# Patient Record
Sex: Male | Born: 1950 | Race: White | Hispanic: No | Marital: Married
Health system: Midwestern US, Academic
[De-identification: ages and names within clinical notes are randomized; demographics above are authoritative.]

---

## 2016-10-25 MED ORDER — RIMABOTULINUMTOXINB 2,500 UNIT/0.5 ML IM SOLN
2500 [IU] | Freq: Once | INTRAMUSCULAR | 0 refills | Status: CP
Start: 2016-10-25 — End: ?

## 2016-10-25 MED ORDER — ONABOTULINUMTOXINA 100 UNIT IJ SOLR
120 [IU] | Freq: Once | INTRAMUSCULAR | 0 refills | Status: CP
Start: 2016-10-25 — End: ?

## 2016-11-11 MED ORDER — CLONAZEPAM 0.5 MG PO TAB
.5 mg | ORAL_TABLET | Freq: Every evening | ORAL | 5 refills | Status: DC
Start: 2016-11-11 — End: 2017-04-07

## 2016-11-18 ENCOUNTER — Encounter: Admit: 2016-11-18 | Discharge: 2016-11-18 | Payer: MEDICARE

## 2016-11-18 MED FILL — PIMAVANSERIN 17 MG PO TAB: 17 mg | ORAL | 30 days supply | Qty: 60 | Fill #4 | Status: CP

## 2016-11-21 ENCOUNTER — Encounter: Admit: 2016-11-21 | Discharge: 2016-11-21 | Payer: MEDICARE

## 2016-12-01 ENCOUNTER — Encounter: Admit: 2016-12-01 | Discharge: 2016-12-01 | Payer: MEDICARE

## 2016-12-09 ENCOUNTER — Encounter: Admit: 2016-12-09 | Discharge: 2016-12-09 | Payer: MEDICARE

## 2016-12-19 ENCOUNTER — Encounter: Admit: 2016-12-19 | Discharge: 2016-12-19 | Payer: MEDICARE

## 2016-12-19 MED FILL — PIMAVANSERIN 17 MG PO TAB: 17 mg | ORAL | 30 days supply | Qty: 60 | Fill #5 | Status: CP

## 2016-12-20 ENCOUNTER — Encounter: Admit: 2016-12-20 | Discharge: 2016-12-20 | Payer: MEDICARE

## 2016-12-27 ENCOUNTER — Encounter: Admit: 2016-12-27 | Discharge: 2016-12-27 | Payer: MEDICARE

## 2016-12-27 DIAGNOSIS — G20 Parkinson's disease: Principal | ICD-10-CM

## 2016-12-27 MED ORDER — CARBIDOPA-LEVODOPA 25-100 MG PO TAB
.5 | ORAL_TABLET | Freq: Every day | ORAL | 11 refills | Status: AC
Start: 2016-12-27 — End: 2017-02-15
  Filled 2016-12-27: qty 420, 90d supply

## 2016-12-27 NOTE — Telephone Encounter
Reduce Sinemet (carbidopa/levodopa) 25/100 to 0.5 tab five times a day

## 2016-12-27 NOTE — Telephone Encounter
This nurse called and left a message for patient/patient's wife to call back for further medication dosage changes.

## 2016-12-27 NOTE — Telephone Encounter
Patient's wife called back and this nurse was able to provide her with verbal orders for reduced sinemet dosage. Patient's wife provided verbal understanding by teaching back new Sinemet dosage.

## 2016-12-27 NOTE — Telephone Encounter
Patient's wife called stating the patient is having increasing difficulty eating d/t his tongue movements and states they are at a loss of what to do. Please advise.

## 2016-12-29 ENCOUNTER — Encounter: Admit: 2016-12-29 | Discharge: 2016-12-29 | Payer: MEDICARE

## 2017-01-06 ENCOUNTER — Encounter: Admit: 2017-01-06 | Discharge: 2017-01-06 | Payer: MEDICARE

## 2017-01-10 ENCOUNTER — Encounter: Admit: 2017-01-10 | Discharge: 2017-01-10 | Payer: MEDICARE

## 2017-01-12 ENCOUNTER — Encounter: Admit: 2017-01-12 | Discharge: 2017-01-12 | Payer: MEDICARE

## 2017-01-13 MED FILL — PIMAVANSERIN 17 MG PO TAB: 17 mg | ORAL | 30 days supply | Qty: 60 | Status: CN

## 2017-01-16 ENCOUNTER — Encounter: Admit: 2017-01-16 | Discharge: 2017-01-16 | Payer: MEDICARE

## 2017-01-16 MED FILL — PIMAVANSERIN 17 MG PO TAB: 17 mg | ORAL | 30 days supply | Qty: 60 | Fill #6 | Status: CP

## 2017-01-17 ENCOUNTER — Encounter: Admit: 2017-01-17 | Discharge: 2017-01-17 | Payer: MEDICARE

## 2017-01-19 ENCOUNTER — Encounter: Admit: 2017-01-19 | Discharge: 2017-01-19 | Payer: MEDICARE

## 2017-01-25 DIAGNOSIS — K117 Disturbances of salivary secretion: Secondary | ICD-10-CM

## 2017-01-25 MED ORDER — RIMABOTULINUMTOXINB 5,000 UNIT/ML IM SOLN
5000 [IU] | Freq: Once | 0 refills | Status: CP
Start: 2017-01-25 — End: ?
  Administered 2017-01-25: 19:00:00 5000 [IU]

## 2017-01-25 MED ORDER — ONABOTULINUMTOXINA 100 UNIT IJ SOLR
100 [IU] | Freq: Once | INTRAMUSCULAR | 0 refills | Status: CP
Start: 2017-01-25 — End: ?
  Administered 2017-01-25: 22:00:00 100 [IU] via INTRAMUSCULAR

## 2017-01-25 NOTE — Procedures
Botulinum toxin injection for treatment of Silorrhea    Biological: Myobloc (botulinum toxin strain B)  Dose in Units: 2500  Units drawn up 2500  Units discarded 0  Last Round: 10/25/16  Benefit: Good  Adverse effects None  Wear off date one week ago  Additional notes NA  Procedure:  Patient was injected with Botulinum toxin B 1000 units in each parotid gland and 250 units in each submandibular gland.     Adverse effects at time of injection: None  Follow up: Telephone  2 weeks  Target time for next injection: 12 weeks  Lot Number: [id number]  Expiration Date: 07/20/19

## 2017-01-25 NOTE — Procedures
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  Muscle Left Right   Flexor digitorum longus 30 30   Flexor digitorum brevis 20 20       Was EMG used? Yes   Adverse effects at time of injection: none  Target time for next injection: 13 weeks  Was patient given the biological specific REMS sheet? No

## 2017-01-26 ENCOUNTER — Ambulatory Visit: Admit: 2017-01-25 | Discharge: 2017-01-26 | Payer: MEDICARE

## 2017-01-26 DIAGNOSIS — G249 Dystonia, unspecified: Secondary | ICD-10-CM

## 2017-01-26 DIAGNOSIS — G248 Other dystonia: Principal | ICD-10-CM

## 2017-02-09 ENCOUNTER — Encounter: Admit: 2017-02-09 | Discharge: 2017-02-09 | Payer: MEDICARE

## 2017-02-13 ENCOUNTER — Encounter: Admit: 2017-02-13 | Discharge: 2017-02-13 | Payer: MEDICARE

## 2017-02-13 MED FILL — PIMAVANSERIN 17 MG PO TAB: 17 mg | ORAL | 30 days supply | Qty: 60 | Fill #7 | Status: CP

## 2017-02-14 ENCOUNTER — Encounter: Admit: 2017-02-14 | Discharge: 2017-02-14 | Payer: MEDICARE

## 2017-02-15 ENCOUNTER — Encounter: Admit: 2017-02-15 | Discharge: 2017-02-15 | Payer: MEDICARE

## 2017-02-15 DIAGNOSIS — H547 Unspecified visual loss: ICD-10-CM

## 2017-02-15 DIAGNOSIS — G20 Parkinson's disease: Principal | ICD-10-CM

## 2017-02-15 DIAGNOSIS — R4189 Other symptoms and signs involving cognitive functions and awareness: ICD-10-CM

## 2017-02-15 MED ORDER — CARBIDOPA-LEVODOPA 25-100 MG PO TAB
1 | Freq: Every day | ORAL | 0 refills | Status: AC
Start: 2017-02-15 — End: 2017-07-14

## 2017-02-15 NOTE — Assessment & Plan Note
His symptoms are stable. No medication changes were recommended during the current  visit.

## 2017-02-15 NOTE — Assessment & Plan Note
His drooling is better with MyoBloc (rimabotulinumtoxin B).

## 2017-02-15 NOTE — Progress Notes
Date of Service: 02/15/2017     Subjective:             Damon Macdonald is a 66 y.o. male.    History of Present Illness     Follow-Up Visit    Since my last visit I am: Slighly worse.    Symptoms Scale:    Memory problems: Moderate, affects some things  Hallucinations/delusions: Mild, illusions  Depression: Moderate, bothersome  Anxiety: Mild, can be bothersome  Apathy: Mild, elective activities affected  Impulsive behavior: Mild, affects family  Nighttime sleep: Moderate, wake up multiple times  Daytime sleepiness: Moderate, difficulty staying awake  Vivid dreams: None  REM sleep behavior disorder: None  Restless leg syndrome: Mild, occasionally, mainly on prolonged seating  Pain or muscle cramps: Marked, limits my activities  Urination: Moderate, occasional accidents  Constipation: Moderate, need OTC medication  Dizziness or lightheadedness: Mild, often on standing  Tiredness/Fatigue: Moderate, often tired  Falling: Marked, more than once a week  Personal care assistance: Mild, I have some difficulty and occasionally need help, but don't need help daily or regularly    Total Score:  Total Score: 43    Assistive Devices:  Assistive devices for getting around: Cane    OFF Time:    OFF time: Yes  Hours/day of OFF time: 5  Number of episodes/day: 5  OFF time is bothersome: All of the time  Muscles spasms or strange posturing: Yes  OFF disability: They are disabling to me    Dyskinesia:    Dyskinesia while awake: Yes  Hours/day of dyskinesia: 4  Number of episodes/day: 5  Dyskinesia disability: They are painful to me    Employment Status:    Employment: Retired - due to PD       Review of Systems   HENT: Positive for trouble swallowing.    Gastrointestinal: Negative.    Genitourinary: Positive for urgency. Negative for decreased urine volume, difficulty urinating, discharge, dysuria, enuresis, flank pain, frequency, genital sores, hematuria, penile pain, penile swelling, scrotal swelling and testicular pain. Neurological: Positive for speech difficulty. Negative for tremors.         Objective:         ??? buPROPion XL (WELLBUTRIN XL) 300 mg tablet Take 1 tablet by mouth every morning. Do not crush or chew.   ??? carbidopa/levodopa (SINEMET) 25/100 mg tablet Take one tablet by mouth five times daily.   ??? carbidopa/levodopa CR (SINEMET CR) 50/200 mg tablet Take 1 tablet by mouth six times daily.   ??? clonazePAM (KLONOPIN) 0.5 mg tablet Take 1 tablet by mouth at bedtime daily.   ??? escitalopram oxalate (LEXAPRO) 20 mg tablet Take 1 tablet by mouth daily.   ??? fludrocortisone (FLORINEF) 0.1 mg tablet Take 1 tablet by mouth twice daily.   ??? gabapentin (NEURONTIN) 300 mg capsule Take 1 capsule by mouth at bedtime daily.   ??? melatonin 5 mg tab Take 5 mg by mouth at bedtime daily.   ??? meloxicam (MOBIC) 15 mg tablet Take  by mouth daily.   ??? pimavanserin 17 mg tab Take 2 tablets by mouth daily.     Vitals:    02/15/17 0959 02/15/17 1003   BP: 156/89 142/86   Pulse: 55 57   Weight: 64 kg (141 lb 3.2 oz)    Height: 168.9 cm (66.5)      Body mass index is 22.45 kg/m???.     Physical Exam    EXAMINATION:     ORIENTATION: Alert and Oriented.  Cranial Nerves: II-XII Unremarkable    Right Left   Bradkinesia  Moderate Mild   Tremor Hands Resting None None    Postural None None    Kinetic None None           Tremor Other: None  Dyskinesia: Mild  Muscle Strength: Unremarkable  Gait: Independent but with substantial impairment    PDQ8 - Quality of Life  Had difficulty getting around in public places?: Often   Had difficulty dressing?: Often   Felt depressed?: Often   Had problems with your close personal relationships?: Occasionally     Had problems with your concentration, for example, when reading or watching TV?: Often   Felt unable to communicate effectively?: Sometimes   Had painful muscle cramps or spasms?: Often   Felt embarrassed in public due to having Parkinson's disease?: Ocassionally              PDQ8 Total Score (32 Possible): 19 PDQ8 Total %: 59                               Assessment and Plan:    Problem   Parkinson Disease (Hcc)    Symptoms began in 2010, initial symptoms was change in handwriting. Diagnosed with Parkinson's disease in 2013. Atypical PD Features: Rapid Progression, Tremor Absent and Severe Dysautonomia  10/26/2012 Mentation Score: 8 Activities of Daily Living Score: 28 Motor Exam: 25 Total UPDRS Score: 61   PDQ 39 IMPACT PDQ Total Percent: 58.33 %  01/22/2013 Symptoms got worse when Mirapex (pramipexole) was discontinued.   04/03/2014 PDQ8 Total %: 38   02/09/2015 10:55 PM   11/18/2014 PDQ8 Total %: 62   04/22/2015 PDQ8 Total %: 34   06/03/2015 PDQ8 Total %: 53   PKG: Bradykinesia 26.6, Dyskinesia 1.7, FDS 6.4  09/13/2015 PDQ8 Total %: 41   05/17/2016 PDQ8 Total %: 56   08/15/2016 PDQ8 Total %: 66   Hand Grip Strength:  RIGHT: 25.2 KG  LEFT: 27.5 KG    02/15/2017 PDQ8 Total %: 59   Hand Grip Strength:  RIGHT: 31.8 KG  LEFT: 30.6 KG    Got worse when Sinemet (carbidopa/levodopa) 25/100 was reduced  Patient was evaluated by Speech therapist, Mardella Layman Heidrick.       Drooling    01/25/17 Received MyoBloc (rimabotulinumtoxin B)      Hallucination    06/03/2015 Improved with Nuplazid (pimavanserin)      Insomnia    On Klonopin (clonazepam)      Memory Change    10/26/2012 MOCA Score (out of 30): 20    08/13/12 MRI brain: Unremarkable  02/13/13 Neuropsychology testing: No evidence of dementia     Orthostatic Hypotension    10/2014: florinef started. Patient reports significant improvement since starting.          Parkinson disease (HCC)  Patient is levodopa responsive with on periods and persistent motor complications with disabling off periods for 5 hours while on medical therapy with Sinemet (carbidopa/levodopa) and he has tried at least one other class of anti-PD therapy namely Mirapex (pramipexole). He is a candidate for Duopa (carbidopa/levodopa enteral suspension). Patient was evaluated by Speech therapist, Mardella Layman Heidrick. Insomnia  His symptoms are stable. No medication changes were recommended during the current  visit.      Orthostatic hypotension  His symptoms are stable. No medication changes were recommended during the current  visit.      Memory change  His symptoms are stable. No medication changes were recommended during the current  visit.      Hallucination  His symptoms are stable. No medication changes were recommended during the current  visit.      Drooling  His drooling is better with MyoBloc (rimabotulinumtoxin B).    Patient will follow up in approximately 6 months.

## 2017-02-15 NOTE — Progress Notes
SPEECH PATHOLOGY ENCOUNTER  DEPARTMENT OF NEUROLOGY    NAME: Damon Macdonald    DOB: December 12, 1950   MRN: [id number]  DATE: 02/15/2017   [facility]IC[facility]    CHIEF COMPLAINT/RELEVANT HISTORY  Time-in: 11:14 AM  Time-out: 11:42 AM    History provided by: patient and wife    Speech: Pt reports his speech is worse. He was always a quiet talker, but it is now quieter. He was a Education officer, museum, and had to use a voice amplifier to help with projecting his voice. He did [employer], which seemed to help. However, due to transportation issues he is no longer able to participate.    Swallow: Pt denies changes to swallow function. He maintains a regular diet (which is difficult due to excess saliva or dry mouth. Mealtimes take up to an hour.), drinks thin liquids and takes pills easily. Pt had botox into the vocal cords according to pt's wife. He feels it helped with volume and swallowing. He reports excess saliva and dry mouth symptoms. Pt feels both affect his swallow function. His weight is stable.       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     PLAN OF CARE    Speech  Group speech therapy [facility]SV[facility] LOUD DVD order information provided. Sound Meter Level application (Decibel 10th) can be download to track your loudness. Complete LOUDness exercises every day!    Hearing  No recommendations    Swallowing  Diet regular diet with thin liquids, think swallow, alternate solids with liquids, small bites/sips, good oral hygiene and stronger flavors may help with providing more sensory input related to swallowing. Follow-up through PD clinic.    Therapist: Mardella Layman Dondi Aime  Date: 02/15/2017

## 2017-02-16 ENCOUNTER — Ambulatory Visit: Admit: 2017-02-15 | Discharge: 2017-02-16 | Payer: MEDICARE

## 2017-02-16 DIAGNOSIS — K117 Disturbances of salivary secretion: ICD-10-CM

## 2017-02-16 DIAGNOSIS — R443 Hallucinations, unspecified: ICD-10-CM

## 2017-02-16 DIAGNOSIS — F3289 Other specified depressive episodes: ICD-10-CM

## 2017-02-16 DIAGNOSIS — G4709 Other insomnia: ICD-10-CM

## 2017-02-16 DIAGNOSIS — I951 Orthostatic hypotension: ICD-10-CM

## 2017-02-16 DIAGNOSIS — R413 Other amnesia: ICD-10-CM

## 2017-02-16 DIAGNOSIS — G20 Parkinson's disease: Principal | ICD-10-CM

## 2017-02-22 ENCOUNTER — Encounter: Admit: 2017-02-22 | Discharge: 2017-02-22 | Payer: MEDICARE

## 2017-02-22 MED ORDER — PIMAVANSERIN 34 MG PO CAP
34 mg | ORAL_CAPSULE | Freq: Every day | ORAL | 11 refills | Status: AC
Start: 2017-02-22 — End: 2018-01-08
  Filled 2017-03-14 (×2): qty 30, 30d supply, fill #1

## 2017-02-22 NOTE — Telephone Encounter
Verbal order received by Dr. Rajesh Pahwa, MD    pimavanserin(+) (NUPLAZID) 34 mg cap capsule  Sig: Take one capsule by mouth daily  #30 x 11 refills

## 2017-02-23 ENCOUNTER — Encounter: Admit: 2017-02-23 | Discharge: 2017-02-23 | Payer: MEDICARE

## 2017-02-24 ENCOUNTER — Encounter: Admit: 2017-02-24 | Discharge: 2017-02-24 | Payer: MEDICARE

## 2017-02-24 NOTE — Progress Notes
Nuplazid prescription recently changed from two 17 mg tablets to one 34 mg capsule. Spoke with patient's wife regarding prescription change.  Initial assessment and education previously completed with same overall dose and medication.  Will reassess patient at later date to follow-up with tolerance and adherence to the medication.    Rozanna Cormany, PharmD, BCACP

## 2017-03-02 ENCOUNTER — Encounter: Admit: 2017-03-02 | Discharge: 2017-03-02 | Payer: MEDICARE

## 2017-03-06 ENCOUNTER — Encounter: Admit: 2017-03-06 | Discharge: 2017-03-06 | Payer: MEDICARE

## 2017-03-14 ENCOUNTER — Encounter: Admit: 2017-03-14 | Discharge: 2017-03-14 | Payer: MEDICARE

## 2017-03-15 ENCOUNTER — Encounter: Admit: 2017-03-15 | Discharge: 2017-03-15 | Payer: MEDICARE

## 2017-03-15 NOTE — Telephone Encounter
Darel Hong from [employer] called. We are needing to document one more cardinal symptom of Parkinson's Disease in order for the patients insurance to cover Duopa. Can you add this to the last office note?

## 2017-03-21 ENCOUNTER — Encounter: Admit: 2017-03-21 | Discharge: 2017-03-21 | Payer: MEDICARE

## 2017-03-29 ENCOUNTER — Encounter: Admit: 2017-03-29 | Discharge: 2017-03-29 | Payer: MEDICARE

## 2017-03-29 NOTE — Telephone Encounter
See documentation from today.

## 2017-03-29 NOTE — Telephone Encounter
Faxed additional info to [facility].

## 2017-03-29 NOTE — Progress Notes
Patient is levodopa responsive with "on" periods and persistent motor complications with disabling "off" periods for 5 hours while on medical therapy with Sinemet (carbidopa/levodopa) and he has tried at least one other class of anti-PD therapy. During his OFF state he has bradykinesia and rigidity. He is a candidate for Duopa (carbidopa/levodopa enteral suspension)

## 2017-04-04 ENCOUNTER — Encounter: Admit: 2017-04-04 | Discharge: 2017-04-04 | Payer: MEDICARE

## 2017-04-07 ENCOUNTER — Encounter: Admit: 2017-04-07 | Discharge: 2017-04-07 | Payer: MEDICARE

## 2017-04-07 DIAGNOSIS — G4752 REM sleep behavior disorder: Principal | ICD-10-CM

## 2017-04-07 MED ORDER — CLONAZEPAM 0.5 MG PO TAB
.5 mg | ORAL_TABLET | Freq: Every evening | ORAL | 5 refills | Status: AC
Start: 2017-04-07 — End: 2017-10-09

## 2017-04-07 NOTE — Telephone Encounter
[facility] pharmacy requesting rx refill. Telephone order received by Dr. Justice Deeds, MD    clonazePAM Damon Macdonald) 0.5 mg tablet  Sig: Take one tablet by mouth at bedtime daily  #30 x 5 refills

## 2017-04-11 ENCOUNTER — Encounter: Admit: 2017-04-11 | Discharge: 2017-04-11 | Payer: MEDICARE

## 2017-04-11 MED FILL — PIMAVANSERIN 34 MG PO CAP: 34 mg | ORAL | 30 days supply | Qty: 30 | Fill #2 | Status: CP

## 2017-04-12 ENCOUNTER — Encounter: Admit: 2017-04-12 | Discharge: 2017-04-12 | Payer: MEDICARE

## 2017-04-18 ENCOUNTER — Encounter: Admit: 2017-04-18 | Discharge: 2017-04-18 | Payer: MEDICARE

## 2017-04-25 ENCOUNTER — Encounter: Admit: 2017-04-25 | Discharge: 2017-04-25 | Payer: MEDICARE

## 2017-04-25 DIAGNOSIS — F3289 Other specified depressive episodes: ICD-10-CM

## 2017-04-25 DIAGNOSIS — I951 Orthostatic hypotension: ICD-10-CM

## 2017-04-25 DIAGNOSIS — G232 Striatonigral degeneration: Principal | ICD-10-CM

## 2017-04-26 MED ORDER — CARBIDOPA-LEVODOPA 50-200 MG PO TBER
ORAL_TABLET | Freq: Every day | 3 refills | Status: AC
Start: 2017-04-26 — End: 2018-01-08

## 2017-04-26 MED ORDER — GABAPENTIN 300 MG PO CAP
300 mg | ORAL_CAPSULE | Freq: Every evening | ORAL | 3 refills | Status: AC
Start: 2017-04-26 — End: 2019-02-18

## 2017-04-26 MED ORDER — FLUDROCORTISONE 0.1 MG PO TAB
0.1 mg | ORAL_TABLET | Freq: Two times a day (BID) | ORAL | 3 refills | 90.00000 days | Status: AC
Start: 2017-04-26 — End: 2018-01-08

## 2017-04-26 MED ORDER — ESCITALOPRAM OXALATE 20 MG PO TAB
20 mg | ORAL_TABLET | Freq: Every day | ORAL | 3 refills | Status: AC
Start: 2017-04-26 — End: 2017-08-30

## 2017-04-26 NOTE — Telephone Encounter
[facility] pharmacy requesting rx refill. Verbal order received by Dr. Justice Deedsajesh Pahwa, MD    carbidopa/levodopa CR (SINEMET CR) 50/200 mg tablet  Sig: TAKE ONE TABLET BY MOUTH 6 TIMES DAILY  #540 x 3 refills    escitalopram oxalate (LEXAPRO) 20 mg tablet  Sig: Take one tablet by mouth daily  #90 x 3 refills    fludrocortisone (FLORINEF) 0.1 mg tablet  Sig: Take one tablet by mouth twice daily  #180 x 3 refills    gabapentin (NEURONTIN) 300 mg capsule  Sig: Take one capsule by mouth at bedtime daily  #90 x 3 refills

## 2017-05-04 ENCOUNTER — Encounter: Admit: 2017-05-04 | Discharge: 2017-05-04 | Payer: MEDICARE

## 2017-05-05 ENCOUNTER — Encounter: Admit: 2017-05-05 | Discharge: 2017-05-05 | Payer: MEDICARE

## 2017-05-05 MED FILL — PIMAVANSERIN 34 MG PO CAP: 34 mg | ORAL | 30 days supply | Qty: 30 | Fill #3 | Status: AC

## 2017-05-08 ENCOUNTER — Encounter: Admit: 2017-05-08 | Discharge: 2017-05-08 | Payer: MEDICARE

## 2017-05-31 ENCOUNTER — Encounter: Admit: 2017-05-31 | Discharge: 2017-05-31 | Payer: MEDICARE

## 2017-05-31 MED FILL — PIMAVANSERIN 34 MG PO CAP: 34 mg | ORAL | 30 days supply | Qty: 30 | Fill #4 | Status: AC

## 2017-06-05 ENCOUNTER — Encounter: Admit: 2017-06-05 | Discharge: 2017-06-05 | Payer: MEDICARE

## 2017-06-26 ENCOUNTER — Encounter: Admit: 2017-06-26 | Discharge: 2017-06-26 | Payer: MEDICARE

## 2017-06-27 ENCOUNTER — Ambulatory Visit: Admit: 2017-06-27 | Discharge: 2017-06-28 | Payer: MEDICARE

## 2017-06-27 DIAGNOSIS — K117 Disturbances of salivary secretion: Secondary | ICD-10-CM

## 2017-06-27 DIAGNOSIS — G249 Dystonia, unspecified: Secondary | ICD-10-CM

## 2017-06-27 MED ORDER — RIMABOTULINUMTOXINB 2,500 UNIT/0.5 ML IM SOLN
2500 [IU] | Freq: Once | INTRAMUSCULAR | 0 refills | Status: CP
Start: 2017-06-27 — End: ?
  Administered 2017-06-27: 19:00:00 2500 [IU] via INTRAMUSCULAR

## 2017-06-27 MED ORDER — ONABOTULINUMTOXINA 100 UNIT IJ SOLR
100 [IU] | Freq: Once | INTRAMUSCULAR | 0 refills | Status: CP
Start: 2017-06-27 — End: ?
  Administered 2017-06-27: 19:00:00 100 [IU] via INTRAMUSCULAR

## 2017-06-28 DIAGNOSIS — R258 Other abnormal involuntary movements: Principal | ICD-10-CM

## 2017-07-03 ENCOUNTER — Encounter: Admit: 2017-07-03 | Discharge: 2017-07-03 | Payer: MEDICARE

## 2017-07-03 MED FILL — PIMAVANSERIN 34 MG PO CAP: 34 mg | ORAL | 30 days supply | Qty: 30 | Fill #5 | Status: AC

## 2017-07-11 ENCOUNTER — Encounter: Admit: 2017-07-11 | Discharge: 2017-07-11 | Payer: MEDICARE

## 2017-07-12 ENCOUNTER — Encounter: Admit: 2017-07-12 | Discharge: 2017-07-12 | Payer: MEDICARE

## 2017-07-12 DIAGNOSIS — F3289 Other specified depressive episodes: ICD-10-CM

## 2017-07-12 DIAGNOSIS — G20 Parkinson's disease: Principal | ICD-10-CM

## 2017-07-13 ENCOUNTER — Encounter: Admit: 2017-07-13 | Discharge: 2017-07-13 | Payer: MEDICARE

## 2017-07-14 MED ORDER — BUPROPION XL 300 MG PO TB24
ORAL_TABLET | 3 refills | Status: AC
Start: 2017-07-14 — End: 2018-01-08

## 2017-07-14 MED ORDER — CARBIDOPA-LEVODOPA 25-100 MG PO TAB
ORAL_TABLET | Freq: Every day | 3 refills | Status: AC
Start: 2017-07-14 — End: 2018-01-08

## 2017-07-19 ENCOUNTER — Encounter: Admit: 2017-07-19 | Discharge: 2017-07-19 | Payer: MEDICARE

## 2017-07-21 ENCOUNTER — Encounter: Admit: 2017-07-21 | Discharge: 2017-07-21 | Payer: MEDICARE

## 2017-07-28 ENCOUNTER — Encounter: Admit: 2017-07-28 | Discharge: 2017-07-28 | Payer: MEDICARE

## 2017-07-28 MED FILL — PIMAVANSERIN 34 MG PO CAP: 34 mg | ORAL | 30 days supply | Qty: 30 | Fill #6 | Status: AC

## 2017-07-31 ENCOUNTER — Encounter: Admit: 2017-07-31 | Discharge: 2017-07-31 | Payer: MEDICARE

## 2017-08-01 ENCOUNTER — Encounter: Admit: 2017-08-01 | Discharge: 2017-08-01 | Payer: MEDICARE

## 2017-08-02 ENCOUNTER — Encounter: Admit: 2017-08-02 | Discharge: 2017-08-02 | Payer: MEDICARE

## 2017-08-25 ENCOUNTER — Encounter: Admit: 2017-08-25 | Discharge: 2017-08-25 | Payer: MEDICARE

## 2017-08-28 ENCOUNTER — Encounter: Admit: 2017-08-28 | Discharge: 2017-08-28 | Payer: MEDICARE

## 2017-08-29 ENCOUNTER — Encounter: Admit: 2017-08-29 | Discharge: 2017-08-29 | Payer: MEDICARE

## 2017-08-29 MED FILL — PIMAVANSERIN 34 MG PO CAP: 34 mg | ORAL | 30 days supply | Qty: 30 | Fill #7 | Status: AC

## 2017-08-30 ENCOUNTER — Encounter: Admit: 2017-08-30 | Discharge: 2017-08-30 | Payer: MEDICARE

## 2017-08-30 ENCOUNTER — Ambulatory Visit: Admit: 2017-08-30 | Discharge: 2017-08-31 | Payer: MEDICARE

## 2017-08-30 DIAGNOSIS — R4189 Other symptoms and signs involving cognitive functions and awareness: ICD-10-CM

## 2017-08-30 DIAGNOSIS — G20 Parkinson's disease: Principal | ICD-10-CM

## 2017-08-30 DIAGNOSIS — H547 Unspecified visual loss: ICD-10-CM

## 2017-08-30 MED ORDER — ESCITALOPRAM OXALATE 20 MG PO TAB
20 mg | ORAL_TABLET | Freq: Every day | ORAL | 3 refills | Status: AC
Start: 2017-08-30 — End: 2018-01-08

## 2017-08-31 DIAGNOSIS — K117 Disturbances of salivary secretion: ICD-10-CM

## 2017-08-31 DIAGNOSIS — G4709 Other insomnia: ICD-10-CM

## 2017-08-31 DIAGNOSIS — I951 Orthostatic hypotension: ICD-10-CM

## 2017-08-31 DIAGNOSIS — R413 Other amnesia: Secondary | ICD-10-CM

## 2017-08-31 DIAGNOSIS — R443 Hallucinations, unspecified: ICD-10-CM

## 2017-08-31 DIAGNOSIS — G4752 REM sleep behavior disorder: ICD-10-CM

## 2017-08-31 DIAGNOSIS — F3289 Other specified depressive episodes: ICD-10-CM

## 2017-08-31 DIAGNOSIS — G20 Parkinson's disease: Principal | ICD-10-CM

## 2017-09-01 ENCOUNTER — Encounter: Admit: 2017-09-01 | Discharge: 2017-09-01 | Payer: MEDICARE

## 2017-09-11 ENCOUNTER — Encounter: Admit: 2017-09-11 | Discharge: 2017-09-11 | Payer: MEDICARE

## 2017-09-21 ENCOUNTER — Encounter: Admit: 2017-09-21 | Discharge: 2017-09-21 | Payer: MEDICARE

## 2017-09-25 ENCOUNTER — Encounter: Admit: 2017-09-25 | Discharge: 2017-09-25 | Payer: MEDICARE

## 2017-09-26 ENCOUNTER — Encounter: Admit: 2017-09-26 | Discharge: 2017-09-26 | Payer: MEDICARE

## 2017-09-27 ENCOUNTER — Encounter: Admit: 2017-09-27 | Discharge: 2017-09-27 | Payer: MEDICARE

## 2017-09-27 MED FILL — PIMAVANSERIN 34 MG PO CAP: 34 mg | ORAL | 30 days supply | Qty: 30 | Fill #8 | Status: AC

## 2017-09-28 ENCOUNTER — Encounter: Admit: 2017-09-28 | Discharge: 2017-09-28 | Payer: MEDICARE

## 2017-10-09 ENCOUNTER — Encounter: Admit: 2017-10-09 | Discharge: 2017-10-09 | Payer: MEDICARE

## 2017-10-09 DIAGNOSIS — G4752 REM sleep behavior disorder: Principal | ICD-10-CM

## 2017-10-09 MED ORDER — CLONAZEPAM 0.5 MG PO TAB
ORAL_TABLET | Freq: Every evening | 1 refills | Status: AC
Start: 2017-10-09 — End: 2018-11-13

## 2017-10-18 ENCOUNTER — Encounter: Admit: 2017-10-18 | Discharge: 2017-10-18 | Payer: MEDICARE

## 2017-10-24 ENCOUNTER — Encounter: Admit: 2017-10-24 | Discharge: 2017-10-24 | Payer: MEDICARE

## 2017-10-24 MED FILL — PIMAVANSERIN 34 MG PO CAP: 34 mg | ORAL | 30 days supply | Qty: 30 | Fill #9 | Status: AC

## 2017-10-25 ENCOUNTER — Encounter: Admit: 2017-10-25 | Discharge: 2017-10-25 | Payer: MEDICARE

## 2017-10-30 ENCOUNTER — Encounter: Admit: 2017-10-30 | Discharge: 2017-10-30 | Payer: MEDICARE

## 2017-11-08 ENCOUNTER — Encounter: Admit: 2017-11-08 | Discharge: 2017-11-08 | Payer: MEDICARE

## 2017-11-20 ENCOUNTER — Encounter: Admit: 2017-11-20 | Discharge: 2017-11-20 | Payer: MEDICARE

## 2017-11-21 ENCOUNTER — Encounter: Admit: 2017-11-21 | Discharge: 2017-11-21 | Payer: MEDICARE

## 2017-11-21 MED FILL — PIMAVANSERIN 34 MG PO CAP: 34 mg | ORAL | 30 days supply | Qty: 30 | Fill #10 | Status: AC

## 2017-11-22 ENCOUNTER — Encounter: Admit: 2017-11-22 | Discharge: 2017-11-22 | Payer: MEDICARE

## 2017-11-27 ENCOUNTER — Encounter: Admit: 2017-11-27 | Discharge: 2017-11-27 | Payer: MEDICARE

## 2017-12-20 ENCOUNTER — Encounter: Admit: 2017-12-20 | Discharge: 2017-12-20 | Payer: MEDICARE

## 2017-12-20 MED FILL — PIMAVANSERIN 34 MG PO CAP: 34 mg | ORAL | 30 days supply | Qty: 30 | Fill #11 | Status: AC

## 2017-12-25 ENCOUNTER — Encounter: Admit: 2017-12-25 | Discharge: 2017-12-25 | Payer: MEDICARE

## 2017-12-28 ENCOUNTER — Encounter: Admit: 2017-12-28 | Discharge: 2017-12-28 | Payer: MEDICARE

## 2018-01-05 ENCOUNTER — Encounter: Admit: 2018-01-05 | Discharge: 2018-01-05 | Payer: MEDICARE

## 2018-01-08 ENCOUNTER — Encounter: Admit: 2018-01-08 | Discharge: 2018-01-08 | Payer: MEDICARE

## 2018-01-08 DIAGNOSIS — F3289 Other specified depressive episodes: Secondary | ICD-10-CM

## 2018-01-08 DIAGNOSIS — G20 Parkinson's disease: Principal | ICD-10-CM

## 2018-01-08 DIAGNOSIS — R4189 Other symptoms and signs involving cognitive functions and awareness: ICD-10-CM

## 2018-01-08 DIAGNOSIS — H547 Unspecified visual loss: ICD-10-CM

## 2018-01-08 MED ORDER — ESCITALOPRAM OXALATE 20 MG PO TAB
20 mg | ORAL_TABLET | Freq: Every day | ORAL | 3 refills | Status: AC
Start: 2018-01-08 — End: 2019-02-05

## 2018-01-08 MED ORDER — PIMAVANSERIN 34 MG PO CAP
34 mg | ORAL_CAPSULE | Freq: Every day | ORAL | 11 refills | Status: AC
Start: 2018-01-08 — End: 2018-01-16

## 2018-01-08 MED ORDER — CARBIDOPA-LEVODOPA 25-100 MG PO TAB
1 | ORAL_TABLET | Freq: Every day | ORAL | 3 refills | Status: AC
Start: 2018-01-08 — End: 2018-06-18

## 2018-01-08 MED ORDER — CARBIDOPA-LEVODOPA 50-200 MG PO TBER
1 | ORAL_TABLET | Freq: Every day | ORAL | 3 refills | Status: AC
Start: 2018-01-08 — End: 2018-05-14

## 2018-01-09 ENCOUNTER — Ambulatory Visit: Admit: 2018-01-08 | Discharge: 2018-01-09 | Payer: MEDICARE

## 2018-01-09 DIAGNOSIS — G4709 Other insomnia: ICD-10-CM

## 2018-01-09 DIAGNOSIS — I951 Orthostatic hypotension: ICD-10-CM

## 2018-01-09 DIAGNOSIS — R413 Other amnesia: ICD-10-CM

## 2018-01-09 DIAGNOSIS — G4710 Hypersomnia, unspecified: ICD-10-CM

## 2018-01-09 DIAGNOSIS — Z9181 History of falling: Secondary | ICD-10-CM

## 2018-01-09 DIAGNOSIS — R443 Hallucinations, unspecified: ICD-10-CM

## 2018-01-16 ENCOUNTER — Encounter: Admit: 2018-01-16 | Discharge: 2018-01-16 | Payer: MEDICARE

## 2018-01-16 MED ORDER — PIMAVANSERIN 34 MG PO CAP
34 mg | ORAL_CAPSULE | Freq: Every day | ORAL | 11 refills | Status: AC
Start: 2018-01-16 — End: 2018-06-18
  Filled 2018-01-18 (×2): qty 30, 30d supply, fill #1

## 2018-01-18 ENCOUNTER — Encounter: Admit: 2018-01-18 | Discharge: 2018-01-18 | Payer: MEDICARE

## 2018-02-16 ENCOUNTER — Encounter: Admit: 2018-02-16 | Discharge: 2018-02-16 | Payer: MEDICARE

## 2018-02-16 MED FILL — PIMAVANSERIN 34 MG PO CAP: 34 mg | ORAL | 30 days supply | Qty: 30 | Fill #2 | Status: AC

## 2018-02-23 ENCOUNTER — Encounter: Admit: 2018-02-23 | Discharge: 2018-02-23 | Payer: MEDICARE

## 2018-03-02 ENCOUNTER — Encounter: Admit: 2018-03-02 | Discharge: 2018-03-02 | Payer: MEDICARE

## 2018-03-08 ENCOUNTER — Encounter: Admit: 2018-03-08 | Discharge: 2018-03-08 | Payer: MEDICARE

## 2018-03-20 ENCOUNTER — Encounter: Admit: 2018-03-20 | Discharge: 2018-03-20 | Payer: MEDICARE

## 2018-03-20 MED FILL — PIMAVANSERIN 34 MG PO CAP: 34 mg | ORAL | 30 days supply | Qty: 30 | Fill #3 | Status: AC

## 2018-03-26 ENCOUNTER — Encounter: Admit: 2018-03-26 | Discharge: 2018-03-26 | Payer: MEDICARE

## 2018-04-02 ENCOUNTER — Encounter: Admit: 2018-04-02 | Discharge: 2018-04-02 | Payer: MEDICARE

## 2018-04-13 ENCOUNTER — Encounter: Admit: 2018-04-13 | Discharge: 2018-04-13 | Payer: MEDICARE

## 2018-04-17 ENCOUNTER — Encounter: Admit: 2018-04-17 | Discharge: 2018-04-17 | Payer: MEDICARE

## 2018-04-18 ENCOUNTER — Encounter: Admit: 2018-04-18 | Discharge: 2018-04-18 | Payer: MEDICARE

## 2018-04-18 MED FILL — PIMAVANSERIN 34 MG PO CAP: 34 mg | ORAL | 30 days supply | Qty: 30 | Fill #4 | Status: AC

## 2018-04-19 ENCOUNTER — Encounter: Admit: 2018-04-19 | Discharge: 2018-04-19 | Payer: MEDICARE

## 2018-04-24 ENCOUNTER — Encounter: Admit: 2018-04-24 | Discharge: 2018-04-24 | Payer: MEDICARE

## 2018-05-01 ENCOUNTER — Encounter: Admit: 2018-05-01 | Discharge: 2018-05-01 | Payer: MEDICARE

## 2018-05-14 ENCOUNTER — Encounter: Admit: 2018-05-14 | Discharge: 2018-05-14 | Payer: MEDICARE

## 2018-05-14 ENCOUNTER — Ambulatory Visit: Admit: 2018-05-14 | Discharge: 2018-05-15 | Payer: MEDICARE

## 2018-05-14 DIAGNOSIS — R4189 Other symptoms and signs involving cognitive functions and awareness: ICD-10-CM

## 2018-05-14 DIAGNOSIS — G4709 Other insomnia: Secondary | ICD-10-CM

## 2018-05-14 DIAGNOSIS — G20 Parkinson's disease: Principal | ICD-10-CM

## 2018-05-14 DIAGNOSIS — H547 Unspecified visual loss: ICD-10-CM

## 2018-05-14 MED ORDER — CARBIDOPA-LEVODOPA 50-200 MG PO TBER
1 | ORAL_TABLET | Freq: Every day | ORAL | 3 refills | Status: AC
Start: 2018-05-14 — End: 2019-02-18

## 2018-05-14 MED ORDER — FLUDROCORTISONE 0.1 MG PO TAB
0.1 mg | ORAL_TABLET | Freq: Every day | ORAL | 5 refills | 90.00000 days | Status: AC
Start: 2018-05-14 — End: 2018-06-18

## 2018-05-14 MED FILL — PIMAVANSERIN 34 MG PO CAP: 34 mg | ORAL | 30 days supply | Qty: 30 | Fill #5 | Status: AC

## 2018-05-15 ENCOUNTER — Encounter: Admit: 2018-05-15 | Discharge: 2018-05-15 | Payer: MEDICARE

## 2018-05-15 DIAGNOSIS — I951 Orthostatic hypotension: Secondary | ICD-10-CM

## 2018-05-15 DIAGNOSIS — G20 Parkinson's disease: Principal | ICD-10-CM

## 2018-05-15 DIAGNOSIS — Z9181 History of falling: ICD-10-CM

## 2018-05-15 DIAGNOSIS — R443 Hallucinations, unspecified: Secondary | ICD-10-CM

## 2018-05-15 DIAGNOSIS — R413 Other amnesia: Secondary | ICD-10-CM

## 2018-05-15 DIAGNOSIS — F3289 Other specified depressive episodes: ICD-10-CM

## 2018-05-15 DIAGNOSIS — G4710 Hypersomnia, unspecified: ICD-10-CM

## 2018-06-07 ENCOUNTER — Encounter: Admit: 2018-06-07 | Discharge: 2018-06-07 | Payer: MEDICARE

## 2018-06-07 MED FILL — PIMAVANSERIN 34 MG PO CAP: 34 mg | ORAL | 30 days supply | Qty: 30 | Fill #6 | Status: AC

## 2018-06-18 ENCOUNTER — Encounter: Admit: 2018-06-18 | Discharge: 2018-06-18 | Payer: MEDICARE

## 2018-06-18 ENCOUNTER — Ambulatory Visit: Admit: 2018-06-18 | Discharge: 2018-06-18 | Payer: MEDICARE

## 2018-06-18 DIAGNOSIS — R443 Hallucinations, unspecified: Secondary | ICD-10-CM

## 2018-06-18 DIAGNOSIS — R4189 Other symptoms and signs involving cognitive functions and awareness: ICD-10-CM

## 2018-06-18 DIAGNOSIS — F3289 Other specified depressive episodes: Secondary | ICD-10-CM

## 2018-06-18 DIAGNOSIS — G20 Parkinson's disease: Principal | ICD-10-CM

## 2018-06-18 DIAGNOSIS — H547 Unspecified visual loss: ICD-10-CM

## 2018-06-18 DIAGNOSIS — G248 Other dystonia: Secondary | ICD-10-CM

## 2018-06-18 MED ORDER — FLUDROCORTISONE 0.1 MG PO TAB
0.1 mg | ORAL_TABLET | Freq: Two times a day (BID) | ORAL | 5 refills | 90.00000 days | Status: AC
Start: 2018-06-18 — End: 2018-09-19

## 2018-06-18 MED ORDER — CARBIDOPA-LEVODOPA 25-100 MG PO TAB
1 | Freq: Every day | ORAL | 0 refills | Status: AC
Start: 2018-06-18 — End: 2019-02-18

## 2018-06-18 MED ORDER — RIMABOTULINUMTOXINB 5,000 UNIT/ML IM SOLN
10000 [IU] | Freq: Once | INTRAMUSCULAR | 0 refills | Status: CP
Start: 2018-06-18 — End: ?
  Administered 2018-06-18: 21:00:00 10000 [IU] via INTRAMUSCULAR

## 2018-06-18 MED ORDER — PIMAVANSERIN 34 MG PO CAP
34 mg | ORAL_CAPSULE | Freq: Every day | ORAL | 5 refills | Status: AC
Start: 2018-06-18 — End: 2018-09-19

## 2018-06-19 ENCOUNTER — Ambulatory Visit: Admit: 2018-06-18 | Discharge: 2018-06-19 | Payer: MEDICARE

## 2018-06-19 DIAGNOSIS — G4709 Other insomnia: ICD-10-CM

## 2018-06-19 DIAGNOSIS — Z9181 History of falling: ICD-10-CM

## 2018-06-19 DIAGNOSIS — G249 Dystonia, unspecified: ICD-10-CM

## 2018-06-19 DIAGNOSIS — I951 Orthostatic hypotension: ICD-10-CM

## 2018-06-19 DIAGNOSIS — R413 Other amnesia: ICD-10-CM

## 2018-06-19 DIAGNOSIS — G20 Parkinson's disease: Principal | ICD-10-CM

## 2018-06-19 DIAGNOSIS — K117 Disturbances of salivary secretion: ICD-10-CM

## 2018-06-19 DIAGNOSIS — R443 Hallucinations, unspecified: ICD-10-CM

## 2018-06-19 DIAGNOSIS — G248 Other dystonia: ICD-10-CM

## 2018-06-19 DIAGNOSIS — F3289 Other specified depressive episodes: Secondary | ICD-10-CM

## 2018-07-25 ENCOUNTER — Encounter: Admit: 2018-07-25 | Discharge: 2018-07-25 | Payer: MEDICARE

## 2018-07-27 ENCOUNTER — Encounter: Admit: 2018-07-27 | Discharge: 2018-07-27 | Payer: MEDICARE

## 2018-07-27 MED ORDER — PIMAVANSERIN 34 MG PO CAP
34 mg | ORAL_CAPSULE | Freq: Every day | ORAL | 11 refills | Status: AC
Start: 2018-07-27 — End: 2018-09-19
  Filled 2018-07-30 (×2): qty 30, 30d supply, fill #1

## 2018-07-30 ENCOUNTER — Encounter: Admit: 2018-07-30 | Discharge: 2018-07-30 | Payer: MEDICARE

## 2018-08-23 ENCOUNTER — Encounter: Admit: 2018-08-23 | Discharge: 2018-08-23 | Payer: MEDICARE

## 2018-08-23 MED FILL — PIMAVANSERIN 34 MG PO CAP: 34 mg | ORAL | 30 days supply | Qty: 30 | Fill #2 | Status: AC

## 2018-08-24 ENCOUNTER — Encounter: Admit: 2018-08-24 | Discharge: 2018-08-24 | Payer: MEDICARE

## 2018-09-19 ENCOUNTER — Ambulatory Visit: Admit: 2018-09-19 | Discharge: 2018-09-20 | Payer: MEDICARE

## 2018-09-19 ENCOUNTER — Encounter: Admit: 2018-09-19 | Discharge: 2018-09-19 | Payer: MEDICARE

## 2018-09-19 DIAGNOSIS — G20 Parkinson's disease: Principal | ICD-10-CM

## 2018-09-19 DIAGNOSIS — H547 Unspecified visual loss: ICD-10-CM

## 2018-09-19 DIAGNOSIS — R413 Other amnesia: ICD-10-CM

## 2018-09-19 DIAGNOSIS — R4189 Other symptoms and signs involving cognitive functions and awareness: ICD-10-CM

## 2018-09-19 MED ORDER — FLUDROCORTISONE 0.1 MG PO TAB
0.1 mg | ORAL_TABLET | Freq: Two times a day (BID) | ORAL | 5 refills | 90.00000 days | Status: DC
Start: 2018-09-19 — End: 2019-08-20

## 2018-09-19 MED ORDER — PIMAVANSERIN 34 MG PO CAP
34 mg | ORAL_CAPSULE | Freq: Every day | ORAL | 11 refills | Status: DC
Start: 2018-09-19 — End: 2018-09-19

## 2018-09-19 MED ORDER — ENTACAPONE 200 MG PO TAB
200 mg | ORAL_TABLET | Freq: Every day | ORAL | 5 refills | Status: DC
Start: 2018-09-19 — End: 2019-02-18

## 2018-09-19 MED ORDER — PIMAVANSERIN 34 MG PO CAP
34 mg | ORAL_CAPSULE | Freq: Every day | ORAL | 11 refills | Status: DC
Start: 2018-09-19 — End: 2018-12-18
  Filled 2018-09-24 (×2): qty 30, 30d supply, fill #1

## 2018-09-19 NOTE — Progress Notes
Bradkinesia  Moderate Mild   Tremor Hands Resting None None    Postural None None    Kinetic None None           Tremor Other: None  Dyskinesia: Slight  Muscle Strength:   Gait: Independent but with substantial impairment                  Assessment and Plan:    Problem   Parkinson Disease (Hcc)    Symptoms began in 2010, initial symptoms was change in handwriting. Diagnosed with Parkinson's disease in 2013. Atypical PD Features: Rapid Progression, Tremor Absent and Severe Dysautonomia  10/26/2012 Mentation Score: 8 Activities of Daily Living Score: 28 Motor Exam: 25 Total UPDRS Score: 61   PDQ 39 IMPACT PDQ Total Percent: 58.33 %  01/22/2013 Symptoms got worse when Mirapex (pramipexole) was discontinued.   02/15/2017 PDQ8 Total %: 59   Got worse when Sinemet (carbidopa/levodopa) 25/100 was reduced  Patient was evaluated by Speech therapist, Mardella Layman Heidrick.    01/08/2018 PDQ8 Total %: 47   05/14/2018 PDQ8 Total %: 75   06/18/2018 PDQ8 Total %: 69      Hallucination    06/03/2015 Improved with Nuplazid (pimavanserin)      Memory Change    10/26/2012 MOCA Score (out of 30): 20    08/13/12 MRI brain: Unremarkable  02/13/13 Neuropsychology testing: No evidence of dementia  01/08/2018 MOCA Score (out of 30): 25          Parkinson disease (HCC)  Patient was started on Comtan (entacapone) for Parkinson's Disease. He was started on Comtan (entacapone) 200 mg with each dose of Sinemet (carbidopa/levodopa) 25/100 6 times a day. Side effects of Comtan (entacapone) were discussed with the patient and he was given a list of common side effects. The patient will call us in 3-4 weeks to update [place] on their Parkinson's Disease status when further medication adjustments if necessary will be made.     Hallucination  His symptoms are stable. No medication changes were recommended during the current  visit.      Memory change  His symptoms are stable. No medication changes were recommended during the current  visit.

## 2018-09-19 NOTE — Assessment & Plan Note
His symptoms are stable. No medication changes were recommended during the current  visit.

## 2018-09-20 ENCOUNTER — Encounter: Admit: 2018-09-20 | Discharge: 2018-09-20 | Payer: MEDICARE

## 2018-09-20 DIAGNOSIS — G20 Parkinson's disease: Principal | ICD-10-CM

## 2018-09-20 DIAGNOSIS — I951 Orthostatic hypotension: Secondary | ICD-10-CM

## 2018-09-20 DIAGNOSIS — R443 Hallucinations, unspecified: ICD-10-CM

## 2018-09-24 ENCOUNTER — Encounter: Admit: 2018-09-24 | Discharge: 2018-09-24 | Payer: MEDICARE

## 2018-09-26 ENCOUNTER — Encounter: Admit: 2018-09-26 | Discharge: 2018-09-26 | Payer: MEDICARE

## 2018-10-24 ENCOUNTER — Encounter: Admit: 2018-10-24 | Discharge: 2018-10-24 | Payer: MEDICARE

## 2018-11-08 ENCOUNTER — Encounter: Admit: 2018-11-08 | Discharge: 2018-11-08 | Payer: MEDICARE

## 2018-11-09 ENCOUNTER — Encounter: Admit: 2018-11-09 | Discharge: 2018-11-09 | Payer: MEDICARE

## 2018-11-09 NOTE — Progress Notes
Clinical Pharmacist Note - [facility]    Reason(s) for visit:    - Nuplazid follow up    Date of Service: 11/09/2018    Visit Type: Telephone Left voice message (LVM) on 11/09/2018 , requesting callback and availability regarding Nuplazid medication for a brief call as we see that her husband may no longer be taking the medication. Will attempt call again if do not hear back in ~ 1 week . Wished her a good holiday weekend.    Accompanied by: N/A        Call placed to spouse as per Dr. Erline Levine - if hallucinations are unde rcontrol would be okay to discontinue Nuplazid. This was prompted due to on 11/09/2018 Pharmacy Patient Advocate (PPA) at [facility] informed me that patient not filling Nuplazid now due to patient being in Hospice. Patient's last fill of Nuplazid 30 days supply is greater than 6 weeks ago.    Follow Up:  - Clinical pharmacist will reach out to patient/proxy in ~ 1 weeks if do not hear back; and as needed    -With PCP and specialists as instructed and as needed    Patient has my contact information and instructed to contact clinic for any additional questions or concerns.  Pertinent recommendations included in patient instruction section of After Visit summary (AVS) for patient/caregiver(s) for face-to-face visits. See Neurologist Encounter AVS for same day visits.    Thank you for the opportunity to care for this patient.    Arbie Cookey, PharmD, BCACP  Clinical Pharmacist  Office: [phone]    [facility]  [facility]

## 2018-11-13 ENCOUNTER — Encounter: Admit: 2018-11-13 | Discharge: 2018-11-13 | Payer: MEDICARE

## 2018-11-13 MED FILL — PIMAVANSERIN 34 MG PO CAP: 34 mg | ORAL | 30 days supply | Qty: 30 | Fill #2 | Status: AC

## 2018-12-14 ENCOUNTER — Encounter: Admit: 2018-12-14 | Discharge: 2018-12-14

## 2018-12-14 NOTE — Progress Notes
Clinical Pharmacist Note - [facility]    Reason(s) for visit:    - Pharmacy medication Reassessment: Pimavanserin (NUPLAZID);  2nd attempt    Date of Service: 12/14/2018    Visit Type: Telephone called spouse Left voice message (LVM) on 12/14/2018 , requesting callback and availability. Will attempt call again if do not hear back in ~ 2 business days .  Accompanied by: N/A      Called to speak to spouse on 12/14/2018, now was bad time, said afternoons would be much better-   -- afternoons much better.  Told her her I would call back later this afternoon.     Called back at 3:20pm had to LVM        Follow Up:  - Patient will be contacted within 60 days of medication dispense for re-assessment of  Pimavanserin (NUPLAZID);  by clinical pharmacist; and as needed  - Clinical pharmacist will reach out to patient/proxy in ~ 0.5 weeks if do not hear back; and as needed    -With PCP and specialists as instructed and as needed    Patient has my contact information and instructed to contact clinic for any additional questions or concerns.  Pertinent recommendations included in patient instruction section of After Visit summary (AVS) for patient/caregiver(s) for face-to-face visits. See Neurologist Encounter AVS for same day visits.    Thank you for the opportunity to care for this patient.    Arbie Cookey, PharmD, BCACP  Clinical Pharmacist  Office: [phone]    [facility]  [facility]          Damon Macdonald is a 68 y.o. male .      Patient in Hospice care at home. Previoulsy on 11/13/2018 with hallucinations and paranoia, previously was controlled on Nuplazid, then they stopped filling Nuplazid and patient placed on Seroquel.  Seroquel 25mg  dose currently not effective. Patient has 1 year of refills on Nuplazid from recent script on 09/19/2018. Pending patient state (in Hospice) may or may not need quetiapine taper. Baseline Characteristics (Nov 2016)               Previously trialed agents for this indication: no               Current medications being used for this indication: Nuplazid (Nov 2016); quetiapine               Allergy and/or intolerance to previous medications for this indication: no               Level of disease activity / description of disease: PD Dx '10, Atypical: rapid progression, tremor absent, severe dysautonomia                Mental status/MOCA (date): 20 (10/24/12); 25 (01/08/18)  ???  Baseline ECG  No recent ECG. Baseline ECG not required in patients deemed at low risk of QTc prolongation.

## 2018-12-18 ENCOUNTER — Encounter: Admit: 2018-12-18 | Discharge: 2018-12-18

## 2018-12-27 ENCOUNTER — Encounter: Admit: 2018-12-27 | Discharge: 2018-12-27

## 2019-01-24 ENCOUNTER — Encounter: Admit: 2019-01-24 | Discharge: 2019-01-24

## 2019-01-24 NOTE — Progress Notes
Clinical Pharmacist Note - [facility]    Reason(s) for visit:    - Medication questions/issues    Date of Service: 01/24/2019    Visit Type: Telephone  Accompanied by: Sherron Monday to Spouse of patient      Received call from [facility] pharmacy today that spouse of patient wanting to speak with me.    I called her back.    Patient is in [facility]. His Carbidopa/Levodopa ran out last Saturday. Spouse of patient gets a notification of the [facility] pharmacy that services the Hospice, that the filling pharmacist that thought he was on too much carbidopa/levodopa that they informed the Hospice provider, Physician Assistant, whom  cut back on his carbidopa/levodopa.    Patient prior to this patient was on:  -Sinemet (Carbidopa/levodopa) 25/100mg  tab - 1 tab by mouth six times a day  -Sinemet CR (carbidopa/levodopa CR) 50/200mg  tab - 1 tab by mouth seven times a day    On Saturday 01/19/2019 patient ran out of Sinemet CR. Then restarted the Sinemet CR this morning (01/24/2019) and his current reduced regimen is:  -Carbidopa/Levodopa 25/100mg  - 1 tablet by mouth five times a day.  -Carbidopa/Levodopa 50/200mg  CR- 1 tab by mouth two times a day.    Hospice team does not want to make any changes for 1 week. Spouse did not want this dose reduction of patient's carbidopa/levodopa. Spouse informed that I will let the Neurologist know and asked if she tried to speak to the Hospice supervising physician. She reported she tried but the provider would not be available for 1 week and spouse does not want to wait that long.      Since this reduction, pateint has been more tired confused, hallucinations got worse initally when Sinemet CR stopped suddenly, is having more tremor.      Spouse is arranging new Hospice care, in [facility] in [place] but has not finalized this decision. As exploring other options. She accepted help/call from our Social worker whom has experience with patient's with Parkinson disease.      Instructed Patient and/or Proxy or Caregiver :    1. I will inform Dr. Erline Levine of this    2. Also, I will let  our Social Worker know your request to reach out to you to explore other hospice options            3. I can reach out to [employer] [facility] [place] - [phone] - to inform pharmacy staff of data of higher dosing of carbidopa/levodopa - so they are aware in future      Patient/Caregiver verbalized understanding of plan, and was thankful for callback.    Follow Up:  - With clinical pharmacist as needed    -With PCP and specialists as instructed and as needed    Patient has my contact information and instructed to contact clinic for any additional questions or concerns.      Thank you for the opportunity to care for this patient.    Arbie Cookey, PharmD, BCACP  Clinical Pharmacist  Office: [phone]    [facility]  [facility]

## 2019-02-05 ENCOUNTER — Encounter: Admit: 2019-02-05 | Discharge: 2019-02-05 | Payer: MEDICARE

## 2019-02-05 DIAGNOSIS — F3289 Other specified depressive episodes: Secondary | ICD-10-CM

## 2019-02-05 MED ORDER — ESCITALOPRAM OXALATE 20 MG PO TAB
20 mg | ORAL_TABLET | Freq: Every day | ORAL | 3 refills | Status: DC
Start: 2019-02-05 — End: 2019-12-31

## 2019-02-05 NOTE — Telephone Encounter
[facility] pharmacy requesting rx refills. Last seen 09/19/2018. LEXAPRO included in plan of care. Rx refill sent.

## 2019-02-18 ENCOUNTER — Encounter: Admit: 2019-02-18 | Discharge: 2019-02-18

## 2019-02-18 DIAGNOSIS — G20 Parkinson's disease: Secondary | ICD-10-CM

## 2019-02-18 DIAGNOSIS — K117 Disturbances of salivary secretion: Secondary | ICD-10-CM

## 2019-02-18 DIAGNOSIS — F3289 Other specified depressive episodes: Secondary | ICD-10-CM

## 2019-02-18 DIAGNOSIS — H547 Unspecified visual loss: Secondary | ICD-10-CM

## 2019-02-18 DIAGNOSIS — R443 Hallucinations, unspecified: Secondary | ICD-10-CM

## 2019-02-18 DIAGNOSIS — R413 Other amnesia: Secondary | ICD-10-CM

## 2019-02-18 DIAGNOSIS — I951 Orthostatic hypotension: Secondary | ICD-10-CM

## 2019-02-18 DIAGNOSIS — Z9181 History of falling: Secondary | ICD-10-CM

## 2019-02-18 DIAGNOSIS — R4189 Other symptoms and signs involving cognitive functions and awareness: Secondary | ICD-10-CM

## 2019-02-18 MED ORDER — CARBIDOPA-LEVODOPA 50-200 MG PO TBER
1 | ORAL_TABLET | Freq: Every day | ORAL | 3 refills | Status: DC
Start: 2019-02-18 — End: 2020-01-01

## 2019-02-18 MED ORDER — CARBIDOPA-LEVODOPA 25-100 MG PO TAB
1 | ORAL_TABLET | Freq: Every day | ORAL | 3 refills | Status: DC
Start: 2019-02-18 — End: 2020-01-01

## 2019-02-18 MED ORDER — QUETIAPINE 25 MG PO TAB
0 refills | Status: DC
Start: 2019-02-18 — End: 2019-08-20

## 2019-02-18 MED ORDER — LORAZEPAM 1 MG PO TAB
1 mg | ORAL_TABLET | Freq: Every evening | ORAL | 5 refills | 12.00000 days | Status: DC
Start: 2019-02-18 — End: 2019-07-18

## 2019-02-18 NOTE — Assessment & Plan Note
The patient's symptoms are currently stable. No medication changes were recommended during this office visit.

## 2019-02-18 NOTE — Progress Notes
ATTESTATION    I personally performed the key portions of the E/M visit, discussed case with resident and concur with her documentation of history, physical exam, assessment, and treatment plan unless otherwise noted.    Staff name:  Luan Pulling, MD Date:  02/18/2019

## 2019-02-18 NOTE — Assessment & Plan Note
Continue Atropine drops prn for sialorrhea

## 2019-02-18 NOTE — Assessment & Plan Note
His symptoms are stable. No medication changes were recommended during the current  visit.

## 2019-02-18 NOTE — Assessment & Plan Note
His symptoms are slightly improved, continue Sinemet 25/100 5x per day, Sinemet CR 50/200 7 times per day.
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
## 2019-02-18 NOTE — Assessment & Plan Note
His symptoms are improved to stable. Currently taking seroquel 50 mg at 1 pm and 25mg  at 5 pm. Can take additional 25mg  at 5 pm if needed for delirium/agitation.

## 2019-02-19 ENCOUNTER — Ambulatory Visit: Admit: 2019-02-18 | Discharge: 2019-02-19

## 2019-02-19 ENCOUNTER — Encounter: Admit: 2019-02-19 | Discharge: 2019-02-19

## 2019-02-19 DIAGNOSIS — G4709 Other insomnia: Secondary | ICD-10-CM

## 2019-02-19 NOTE — Telephone Encounter
F/u with Mrs. Lacaze as there was a reported fall yesterday at the parking lot. Mrs. Dib said pt is fine. Encourage to call prn

## 2019-03-10 ENCOUNTER — Encounter: Admit: 2019-03-10 | Discharge: 2019-03-10 | Payer: MEDICARE

## 2019-03-14 ENCOUNTER — Encounter: Admit: 2019-03-14 | Discharge: 2019-03-14 | Payer: MEDICARE

## 2019-07-18 ENCOUNTER — Encounter: Admit: 2019-07-18 | Discharge: 2019-07-18 | Payer: MEDICARE

## 2019-07-18 NOTE — Telephone Encounter
[facility] said they are unable to get LORazepam 1mg . They said if we could changed the script to 0.5mg  - take 2 tabs at bedtime?

## 2019-07-19 MED ORDER — LORAZEPAM 0.5 MG PO TAB
2 | ORAL_TABLET | Freq: Every evening | ORAL | 5 refills | 12.00000 days | Status: DC
Start: 2019-07-19 — End: 2019-08-20

## 2019-08-01 ENCOUNTER — Encounter: Admit: 2019-08-01 | Discharge: 2019-08-01 | Payer: MEDICARE

## 2019-08-01 MED ORDER — LORAZEPAM 1 MG PO TAB
ORAL_TABLET | Freq: Every evening | 5 refills
Start: 2019-08-01 — End: ?

## 2019-08-20 ENCOUNTER — Encounter: Admit: 2019-08-20 | Discharge: 2019-08-20 | Payer: MEDICARE

## 2019-08-20 ENCOUNTER — Ambulatory Visit: Admit: 2019-08-20 | Discharge: 2019-08-20 | Payer: MEDICARE

## 2019-08-20 DIAGNOSIS — F3289 Other specified depressive episodes: Secondary | ICD-10-CM

## 2019-08-20 DIAGNOSIS — G20 Parkinson's disease: Secondary | ICD-10-CM

## 2019-08-20 DIAGNOSIS — H547 Unspecified visual loss: Secondary | ICD-10-CM

## 2019-08-20 DIAGNOSIS — R4189 Other symptoms and signs involving cognitive functions and awareness: Secondary | ICD-10-CM

## 2019-08-20 DIAGNOSIS — I951 Orthostatic hypotension: Secondary | ICD-10-CM

## 2019-08-20 DIAGNOSIS — R443 Hallucinations, unspecified: Secondary | ICD-10-CM

## 2019-08-20 DIAGNOSIS — Z9181 History of falling: Secondary | ICD-10-CM

## 2019-08-20 DIAGNOSIS — R413 Other amnesia: Secondary | ICD-10-CM

## 2019-08-20 DIAGNOSIS — G4709 Other insomnia: Secondary | ICD-10-CM

## 2019-08-20 MED ORDER — QUETIAPINE 25 MG PO TAB
ORAL_TABLET | 3 refills | Status: DC
Start: 2019-08-20 — End: 2019-11-15

## 2019-08-20 MED ORDER — FLUDROCORTISONE 0.1 MG PO TAB
0.1 mg | ORAL_TABLET | Freq: Every day | ORAL | 3 refills | 90.00000 days | Status: DC
Start: 2019-08-20 — End: 2019-12-30

## 2019-08-20 MED ORDER — RIVASTIGMINE TARTRATE 1.5 MG PO CAP
1.5 mg | ORAL_CAPSULE | Freq: Two times a day (BID) | ORAL | 1 refills | 90.00000 days | Status: DC
Start: 2019-08-20 — End: 2019-10-02

## 2019-08-20 MED ORDER — LORAZEPAM 0.5 MG PO TAB
.5 mg | ORAL_TABLET | Freq: Every evening | ORAL | 5 refills | 12.00000 days | Status: AC
Start: 2019-08-20 — End: ?

## 2019-08-20 NOTE — Progress Notes
Date of Service: 08/20/2019     Subjective:             Damon Macdonald is a 69 y.o. male.    History of Present Illness     Follow-Up Visit    Since my last visit I am: Slighly worse.    Symptoms Scale:    Memory problems: Moderate, affects some things  Hallucinations/delusions: Slight, sense of presence  Depression: Mild, occurs due to a reason  Anxiety: Mild, can be bothersome  Apathy: Moderate, daily activities are put off  Impulsive behavior: None  Nighttime sleep: Marked, difficulty falling and staying asleep  Daytime sleepiness: Moderate, difficulty staying awake  Vivid dreams: None  REM sleep behavior disorder: None  Restless leg syndrome: Mild, occasionally, mainly on prolonged seating  Pain or muscle cramps: Marked, limits my activities  Urination: Moderate, occasional accidents  Constipation: Moderate, need OTC medication  Dizziness or lightheadedness: Moderate, often leads to falls  Tiredness/Fatigue: Mild, occasionally tired  Falling: Moderate, multiple times a month  Personal care assistance: Mild, I have some difficulty and occasionally need help, but don't need help daily or regularly    Total Score:  Total Score: 40    Assistive Devices:  Assistive devices for getting around: Cane    OFF Time:    OFF time: Yes  Hours/day of OFF time: 2  Number of episodes/day: 2  OFF time is bothersome: Some of the time  Muscles spasms or strange posturing: Yes  OFF disability: They are disabling to me    Dyskinesia:    Dyskinesia while awake: Yes  Hours/day of dyskinesia: 1  Number of episodes/day: 1  Dyskinesia disability: Do not bother me or my partner    Employment Status:    Employment: On disability and cannot work at all       Review of Systems   HENT: Positive for drooling and trouble swallowing.    Gastrointestinal: Negative.    Genitourinary: Positive for urgency. Negative for decreased urine volume, difficulty urinating, discharge, dysuria, enuresis, flank pain, frequency, genital sores, hematuria, penile pain, penile swelling, scrotal swelling and testicular pain.   Neurological: Positive for speech difficulty. Negative for tremors.         Objective:         ? atropine (ATROPISOL) 1 % ophthalmic solution 2 drops. Twice daily as needed for drooling   ? carbidopa/levodopa (SINEMET) 25/100 mg tablet Take one tablet by mouth five times daily. 7am,11a, 1p, 3p, 5p   ? carbidopa/levodopa CR (SINEMET CR) 50/200 mg tablet Take one tablet by mouth seven times daily. At 12-26-09-1-3-5-and 7pm   ? escitalopram oxalate (LEXAPRO) 20 mg tablet Take one tablet by mouth daily.   ? fludrocortisone (FLORINEF) 0.1 mg tablet Take one tablet by mouth twice daily. (Patient taking differently: Take 0.1 mg by mouth daily. 7am)   ? loratadine (CLARITIN) 10 mg tablet Take 10 mg by mouth every morning.   ? LORazepam (ATIVAN) 0.5 mg tablet Take two tablets by mouth at bedtime daily. Indications: anxious (Patient taking differently: Take 2 tablets by mouth at bedtime daily. Take one tablet by mouth at bedtime  Indications: anxious)   ? MELATONIN PO Take 5 mg by mouth.   ? meloxicam (MOBIC) 15 mg tablet Take 15 mg by mouth daily.   ? naproxen sodium (ALEVE PO) Take 1 tablet by mouth daily.   ? QUEtiapine (SEROQUEL) 25 mg tablet Taking 2 tab at 1pm and 1 tab at 5pm (Patient taking differently:  25 mg. Taking 2 tab at 11am and 2 tab at 5pm)     Vitals:    08/20/19 1328 08/20/19 1341   BP: 93/68 95/65   BP Source: Arm, Left Upper Arm, Left Upper   Patient Position: Sitting Standing   Pulse: 65 66   Weight: 57.2 kg (126 lb)    PainSc: Eight      Body mass index is 20.01 kg/m?Marland Kitchen     Physical Exam    EXAMINATION:     ORIENTATION: Alert and Oriented.    Cranial Nerves:  reduced facial expression and soft voice.      Right Left   Bradkinesia  Moderate Mild   Tremor Hands Resting None None    Postural None None    Kinetic None None           Tremor Other: None  Dyskinesia: None  Muscle Strength: Unremarkable  Gait: Independent but with substantial impairment PDQ8 - Quality of Life  Had difficulty getting around in public places?: Ocassionally   Had difficulty dressing?: Occasionally   Felt depressed?: Often   Had problems with your close personal relationships?: Often     Had problems with your concentration, for example, when reading or watching TV?: Often   Felt unable to communicate effectively?: Often   Had painful muscle cramps or spasms?: Occasionally   Felt embarrassed in public due to having Parkinson's disease?: Sometimes              PDQ8 Total Score (32 Possible): 17  PDQ8 Total %: 53          Falls Risk Score:   13  High risk of falls         Timed Up and Go w/o Pushing: 22 Seconds     Geriatric Depression Scale: 23 severe depression                Assessment and Plan:    Problem   Parkinson Disease (Hcc)    Symptoms began in 2010, initial symptoms was change in handwriting. Diagnosed with Parkinson's disease in 2013. Atypical PD Features: Rapid Progression, Tremor Absent and Severe Dysautonomia  10/26/2012 Mentation Score: 8 Activities of Daily Living Score: 28 Motor Exam: 25 Total UPDRS Score: 61   PDQ 39 IMPACT PDQ Total Percent: 58.33 %  01/22/2013 Symptoms got worse when Mirapex (pramipexole) was discontinued.   Got worse when Sinemet (carbidopa/levodopa) 25/100 was reduced  Patient was evaluated by Speech therapist, Mardella Layman Heidrick.    06/18/2018 PDQ8 Total %: 69   02/18/19  PDQ8 Total %: 62   08/20/2019 PDQ8 Total %: 53      At Risk for Falls    01/08/2018 Falls Risk Score:   14 Timed Up and Go w/o Pushing: 12 Seconds     05/14/2018 Falls Risk Score:   13 Timed Up and Go w/o Pushing: 18 Seconds     06/18/2018 Falls Risk Score:   14 Timed Up and Go w/o Pushing: 23 Seconds     08/20/2019 Falls Risk Score:   13 Timed Up and Go w/o Pushing: 22 Seconds        Hallucination    06/03/2015 Improved with Nuplazid (pimavanserin)   02/18/19 Off Nuplazid during hospice care. Not currently on.   08/20/2019 Not in hospice     Insomnia    02/18/19 On lorazepam and seroquel Depression    10/26/2012 Geriatric Depression Scale: 15    01/22/2013 Geriatric Depression Scale: 16  On Lexapro (escitalopram)  06/24/2013 On Wellbutrin (bupropion)   02/09/2015 Geriatric Depression Scale: 20   01/08/2018 Wellbutrin (bupropion) was discontinued by Primary Care Physician   02/18/19: Geriatric Depression Scale: 18        Memory Change    10/26/2012 MOCA Score (out of 30): 20    08/13/12 MRI brain: Unremarkable  02/13/13 Neuropsychology testing: No evidence of dementia  01/08/2018 MOCA Score (out of 30): 25      Orthostatic Hypotension    10/2014: florinef started. Patient reports significant improvement since starting.   06/18/2018 On Florinef (fludrocortisone)           Parkinson disease (HCC)  His symptoms are stable. No medication changes were recommended during the current  visit.      Depression  His symptoms are stable. No medication changes were recommended during the current  visit.      Memory change  Symptoms are worse since the last visit. Patient was started on Exelon (rivastigmine) for memory problems. He was started on Exelon (rivastigmine) 1.5 mg twice a day. Side effects of Exelon (rivastigmine) were discussed with the patient and he was given a list of common side effects. The patient will call us in 4 weeks for dose adjustments.       Orthostatic hypotension  His symptoms are stable. No medication changes were recommended during the current  visit.      Insomnia  His symptoms are stable. No medication changes were recommended during the current  visit.      At risk for falls  Fall precautions discussed.    Hallucination  His symptoms are stable. No medication changes were recommended during the current  visit.      Patient will follow up in approximately 6 months.

## 2019-08-20 NOTE — Assessment & Plan Note
His symptoms are stable. No medication changes were recommended during the current  visit.

## 2019-08-20 NOTE — Patient Instructions
You were prescribed Exelon (rivastigmine) for your memory problems. Exelon capsules are available in 1.5 mg, 3 mg, 4.5 mg and 6 mg strengths.    Initial Treatment:  You will start Exelon (rivastigmine) one capsule twice a day with meals.  If you do not have side effects to this dose, please call the office after one month to increase the dose.     Common Side Effects  As with all medications, there are potential side effects that may occur. Common side effects include weight loss, indigestion, loss of appetite, nausea, vomiting, dizziness, increase tremor, and drooling.     Please call [phone] if you have any questions or side effects while taking Exelon.

## 2019-08-20 NOTE — Assessment & Plan Note
Fall precautions discussed.

## 2019-09-29 IMAGING — CR CHEST
2 series · 2 of 2 positions shown · non-contrast
Comparison: none

[chest pa x-wise]
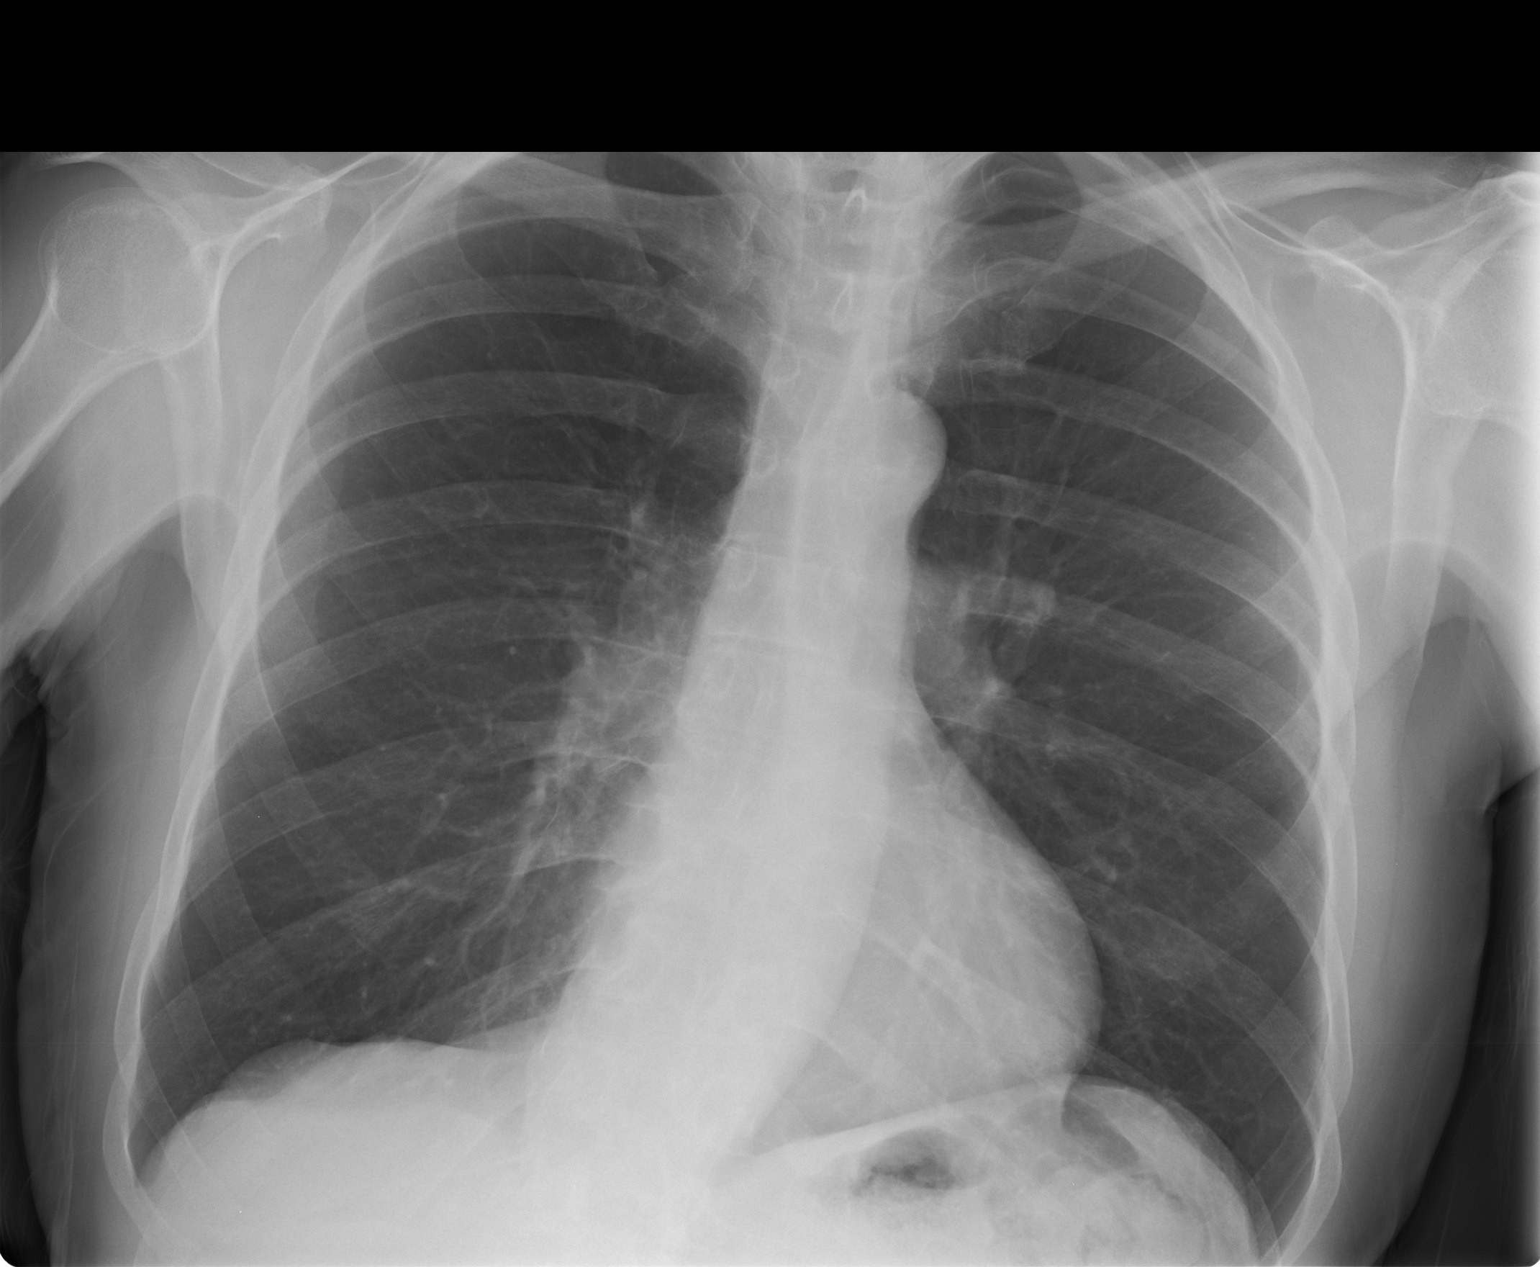

[chest lat]
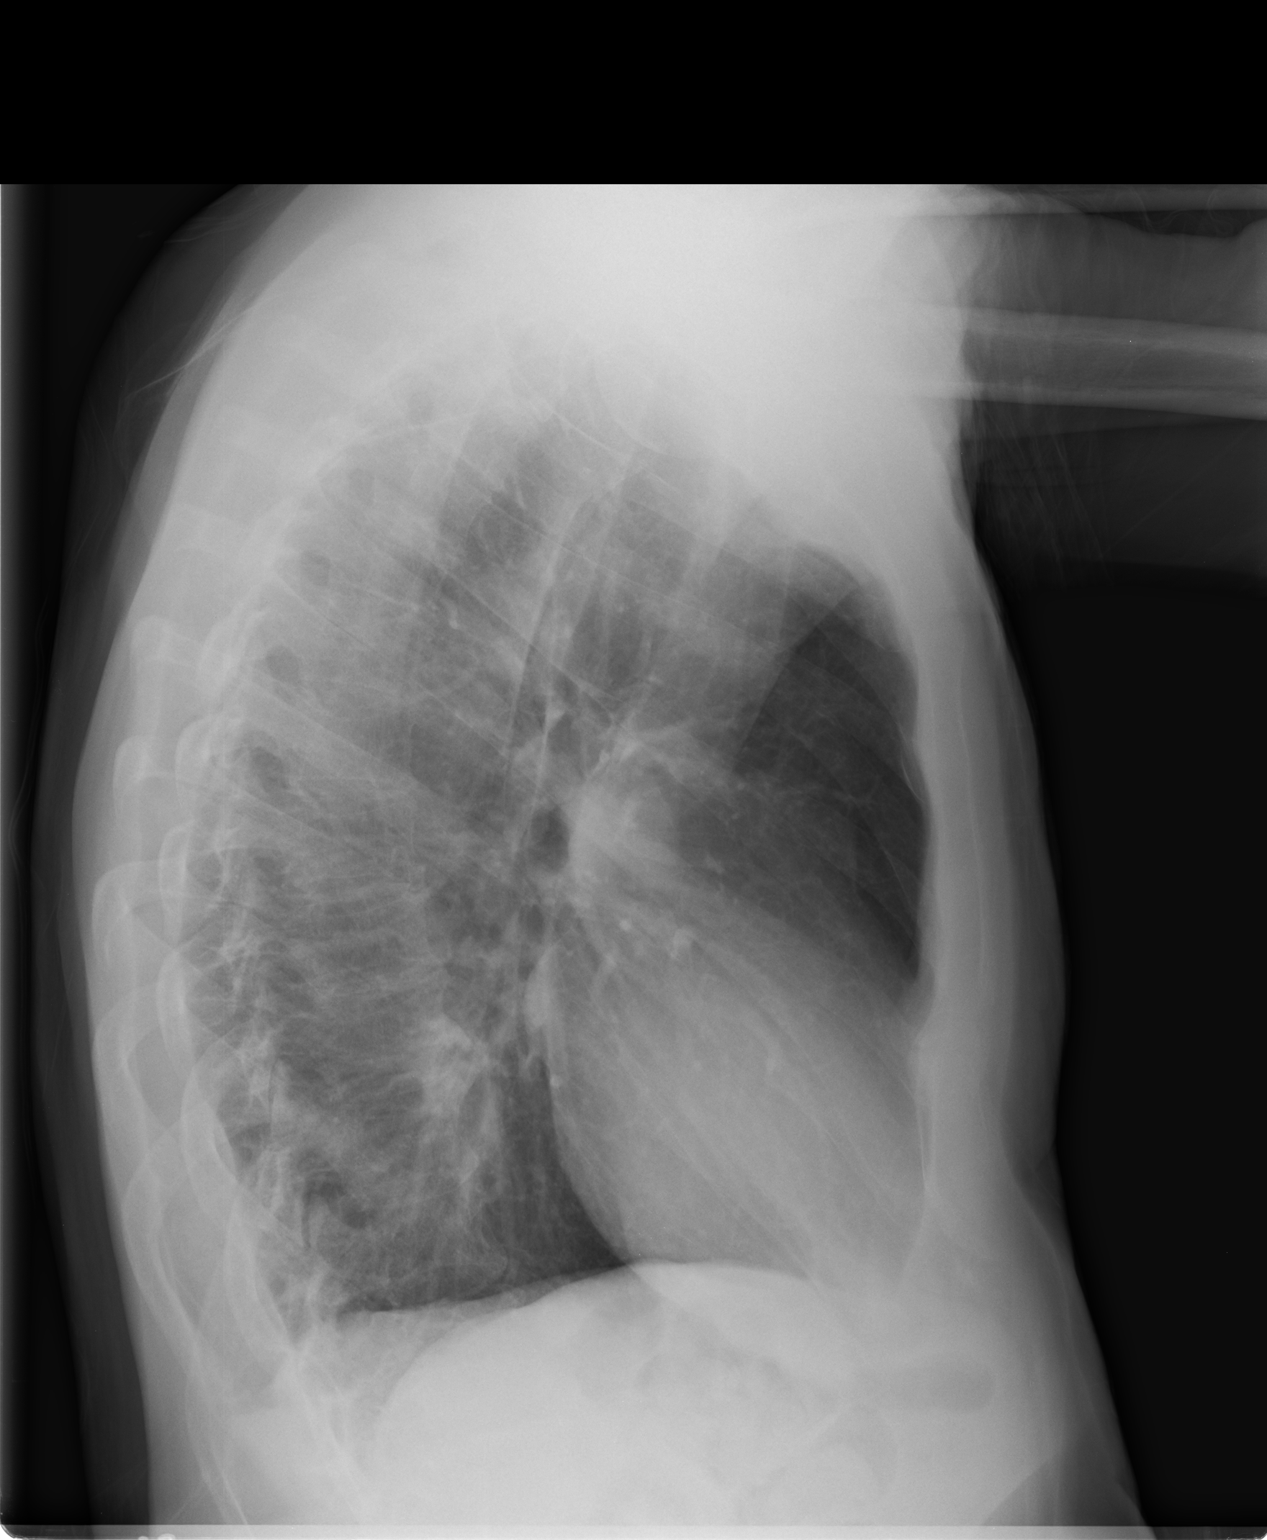

[2 of 2 positions shown; findings below may reference images not displayed]

DIAGNOSTIC STUDIES

EXAM

RADIOLOGICAL EXAMINATION, CHEST; 2 VIEWS FRONTAL AND LATERAL CPT 69232

INDICATION

FB sensation lower [DATE] esophagus- onset today p a.m. pills
FB SENSATION LOWER [DATE] ESOPHAGUS. SB/CK

TECHNIQUE

2 views of the chest were acquired.

COMPARISONS

No prior studies are available for comparison.

FINDINGS

The cardiac silhouette is within normal limits for size. The mediastinum is not widened or
deviated. The lungs are clear and the costophrenic sulci are sharp. Pulmonary vasculature is normal
caliber. No pneumothorax is identified. No retained radiopaque foreign body is identified.

IMPRESSION

No acute cardiopulmonary process. No retained radiopaque foreign body is identified.

## 2019-10-02 ENCOUNTER — Encounter: Admit: 2019-10-02 | Discharge: 2019-10-02 | Payer: MEDICARE

## 2019-10-02 DIAGNOSIS — R413 Other amnesia: Secondary | ICD-10-CM

## 2019-10-02 MED ORDER — RIVASTIGMINE TARTRATE 1.5 MG PO CAP
1.5 mg | ORAL_CAPSULE | Freq: Two times a day (BID) | ORAL | 5 refills | 90.00000 days | Status: DC
Start: 2019-10-02 — End: 2020-01-01

## 2019-10-21 ENCOUNTER — Encounter: Admit: 2019-10-21 | Discharge: 2019-10-21 | Payer: MEDICARE

## 2019-10-21 MED ORDER — PIMAVANSERIN 34 MG PO CAP
34 mg | ORAL_CAPSULE | Freq: Every day | ORAL | 11 refills | Status: AC
Start: 2019-10-21 — End: ?
  Filled 2019-11-08: qty 30, 30d supply, fill #1

## 2019-10-21 NOTE — Telephone Encounter
LVM for Mrs. Woodle to return call

## 2019-10-21 NOTE — Telephone Encounter
If he is not in hospice would like to restart Nuplazid (pimavanserin)
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
## 2019-10-21 NOTE — Telephone Encounter
Damon Macdonald voiced appreciations

## 2019-10-22 ENCOUNTER — Encounter: Admit: 2019-10-22 | Discharge: 2019-10-22 | Payer: MEDICARE

## 2019-10-22 NOTE — Progress Notes
The Prior Authorization for Nuplazid was submitted for [employer] via Cover My Meds.  Will continue to follow.    Foye Spurling  Pharmacy Patient Advocate  [phone]

## 2019-10-24 ENCOUNTER — Encounter: Admit: 2019-10-24 | Discharge: 2019-10-24 | Payer: MEDICARE

## 2019-11-07 ENCOUNTER — Encounter: Admit: 2019-11-07 | Discharge: 2019-11-07 | Payer: MEDICARE

## 2019-11-08 ENCOUNTER — Encounter: Admit: 2019-11-08 | Discharge: 2019-11-08 | Payer: MEDICARE

## 2019-11-14 ENCOUNTER — Encounter: Admit: 2019-11-14 | Discharge: 2019-11-14 | Payer: MEDICARE

## 2019-11-14 NOTE — Progress Notes
Patient was restarted on Nuplazid. Contacted patient's spouse to for re-education. No questions about the Nuplazid at this time, however she did mention hallucinations are worse and is concerned with how long it will take for the Nuplazid to start working. Will discuss possible dose increase of quetiapine with provider while awaiting effects of Nuplazid to take place. It may take up to 6 weeks for patients to see benefit from medication.    [name], PharmD, BCPS  Neurology Clinic Pharmacist

## 2019-11-15 ENCOUNTER — Encounter: Admit: 2019-11-15 | Discharge: 2019-11-15 | Payer: MEDICARE

## 2019-11-15 DIAGNOSIS — R443 Hallucinations, unspecified: Secondary | ICD-10-CM

## 2019-11-15 MED ORDER — QUETIAPINE 25 MG PO TAB
75 mg | ORAL_TABLET | Freq: Two times a day (BID) | ORAL | 1 refills | Status: DC
Start: 2019-11-15 — End: 2020-01-01

## 2019-11-15 NOTE — Progress Notes
Discussed quetiapine dose adjustment with patient's wife. Patient's wife educated to contact provider or neurology pharmacist if hallucinations do not improve after 1 week on increased quetiapine dose. New script sent to [facility].    [name], PharmD, BCPS  Neurology Clinic Pharmacist

## 2019-11-15 NOTE — Progress Notes
Contacted patient's wife to discuss quetiapine dose adjustment. She is unavailable to talk, but will call neurology pharmacist back this afternoon.    [name], PharmD, BCPS  Neurology Clinic Pharmacist

## 2019-11-23 ENCOUNTER — Encounter: Admit: 2019-11-23 | Discharge: 2019-11-23 | Payer: MEDICARE

## 2019-12-09 ENCOUNTER — Encounter: Admit: 2019-12-09 | Discharge: 2019-12-09 | Payer: MEDICARE

## 2019-12-10 ENCOUNTER — Encounter: Admit: 2019-12-10 | Discharge: 2019-12-10 | Payer: MEDICARE

## 2019-12-10 MED FILL — PIMAVANSERIN 34 MG PO CAP: 34 mg | ORAL | 30 days supply | Qty: 30 | Fill #2 | Status: AC

## 2019-12-26 ENCOUNTER — Encounter: Admit: 2019-12-26 | Discharge: 2019-12-26 | Payer: MEDICARE

## 2019-12-26 NOTE — Progress Notes
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 The regimen of 34 mg by mouth once daily is planned to continue indefinitely which is appropriate for Damon Macdonald. No renal or hepatic adjustments are required.  . At this time there is no planned dose titration.     Hallucinations/PD Psychosis Reassessment: (Patient or Caregiver/proxy)  ? Over the past week have you seen, heard, smelled or felt things that were not there?: 0: Normal: No hallucinations or psychotic behavior.    ? How frequently do you see or hear things (visual and/or auditory hallucinations) that are not there?: 0.Never    ? Can you tell that these things (hallucinations, delusions) are not real?: 0. I am not having them now.    ? When you have the hallucinations, do the hallucinations threaten you in any way? This means the actions against you, not just the fact they are there.: 0. I don't have any hallucinations now    Therapeutic Goals:  Initial reduction in severity and/or frequency of hallucinations and/or delusions after at least 8 weeks of therapy: Patient's wife reports that hallucinations have subsided after the addition of quetiapine 75mg  BID. However, patient's agitation has worsened, especially over the past week.  As the patient is achieving therapeutic benefit from the therapy, the plan is to continue current therapy.      Labs and Diagnostic tests:  Labs:  No results found for: NA, K, CL, CO2, GAP, BUN, CR, GLU, CA, PO4, MG, ALBUMIN, TOTPROT, ALKPHOS, AST, ALT, TOTBILI, GFR, GFRAA    Baseline ECG  No recent ECG. Baseline ECG not required in patients deemed at low risk of QTc prolongation.    Allergies:   Allergies   Allergen Reactions   ? Seasonal Allergies SEE COMMENTS     Runny nose, itchy eyes Immunizations:    There is no immunization history on file for this patient.  Vaccination history was reviewed. The patient was reminded about the importance of receiving an annual influenza vaccine as indicated.       Medication Reconciliation:  A medication history and reconciliation were performed (including prescription medications, supplements, over the counter, and herbal products). The medication list was updated and the patient's current medication list is included below.     Home Medications    Medication Sig   atropine (ATROPISOL) 1 % ophthalmic solution 2 drops. Twice daily as needed for drooling   carbidopa/levodopa (SINEMET) 25/100 mg tablet Take one tablet by mouth five times daily. 7am,11a, 1p, 3p, 5p   carbidopa/levodopa CR (SINEMET CR) 50/200 mg tablet Take one tablet by mouth seven times daily. At 12-26-09-1-3-5-and 7pm   escitalopram oxalate (LEXAPRO) 20 mg tablet Take one tablet by mouth daily.   fludrocortisone (FLORINEF) 0.1 mg tablet Take one tablet by mouth daily. 7am   loratadine (CLARITIN) 10 mg tablet Take 10 mg by mouth every morning.   LORazepam (ATIVAN) 0.5 mg tablet Take one tablet by mouth at bedtime daily. And 1 tab as needed  Indications: anxious   MELATONIN PO Take 5 mg by mouth.   meloxicam (MOBIC) 15 mg tablet Take 15 mg by mouth daily.   naproxen sodium (ALEVE PO) Take 1 tablet by mouth daily.   pimavanserin (NUPLAZID) 34 mg cap capsule Take one capsule by mouth daily.   QUEtiapine (SEROQUEL) 25 mg tablet Take three  tablets by mouth twice daily. (taking at 12n-5pm)   rivastigmine tartrate (EXELON) 1.5 mg capsule TAKE ONE CAPSULE BY MOUTH TWICE DAILY       Drug-drug and drug-food interactions between the patients' specialty medication and their medication list were assessed and reviewed with the patient. The patient was instructed to speak with their health care provider before starting any new drugs, including prescription or over the counter, natural / herbal products, or vitamins.    Drug Interactions:  No new significant drug-drug or drug-food interactions were identified.    Drug-Food Interactions  Patient should limit/avoid frequent use of grapefruit. This medication can be taken with or without food.    Adverse effects:  Damon Macdonald is not experiencing any significant adverse effects to this medication regimen.    Adherence:  Refill and adherence history were reviewed with the patient. The patient is adherent with refills and is meeting their refill goal. They report no missed doses over the past 30 days.  The patient is meeting their adherence goal. The patient was reminded about the refill process and re-educated on the importance of adherence.  Overall medication adherence status will be discussed with the care team as deemed appropriate.    Safety Precautions:  Contraindications: Damon Macdonald has no contraindications to this medication.  Risk Evaluation and Mitigation Strategy (REMS) Assessment:  No REMS is required for this medication.   Pregnancy status: Pregnancy status was assessed and determined to be: Male: education not applicable..      Follow Up Plan:   Patient's wife is feeling very overwhelmed, especially in the past week with patient's increase in agitation. Offered to contact our social worker, Damon Macdonald, and have her reach out to discuss resources or support. Damon Macdonald said she would really appreciate that. Damon Macdonald/Caregiver was given the opportunity to ask questions and did not have any questions at this time. The patient was reminded of the refill process as discussed during the medication education.  Damon Macdonald/Caregiver was encouraged to call the Neurology pharmacist at [phone] with questions. The patient will be contacted to complete a medication reassessment within 1 year.    [name], PHARMD, BCPS  [facility]  Neurology Department   Phone Number: [phone]  Email: [email or web address]

## 2019-12-27 ENCOUNTER — Encounter: Admit: 2019-12-27 | Discharge: 2019-12-27 | Payer: MEDICARE

## 2019-12-27 NOTE — Telephone Encounter
Clinical Social Worker received request from Pharmacist, Lawson Radar, to reach out to Rider's spouse/ care taker, Diamond Nickel, to assess for needs.    CSW called Diamond Nickel at number listed in record. Diamond Nickel was thankful for call and explained that she is overwhelmed.  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    Diamond Nickel shared that she in the only care giver for Diomede. He has to be closely watched 24 hrs/ day. She has no family or friends left at this point that are willing to step in and watch him so she is able to get other things done.    Diamond Nickel is working on Dietitian their house to put on [employer]. They want to downsize and find a place only big enough for the 2 of them. An adult child is currently living with them, but does not contribute financially or with house work.  Once they sell the house, they plan to move to [place] where they will be near Beatrice's family who will assist with care and have access to affordable in home services/ health care.  It is difficult for Diamond Nickel to finish what needs to be done on the house since she has to be with Aurther Loft at all times.    Diamond Nickel was very tearful throughout the conversation. She shared that Chadron had been on hospice in the past and had been eligible for respite. Now that he is no longer on hospice, the respite facility said that insurance would not cover the stay and it would be an out of pocket expense.    Diamond Nickel explained that at the beginning of Nikolos's disease (prior to dx), he canceled his LTC insurance, life insurance, and spent $40k. This has left them in a bind financially. Diamond Nickel is unable to pay for private in home care.  Ideally, Carliss will qualify for service that is covered by Medicare like home health to assess for new needs considering progression of symptoms and changes in mobility. Hospice could be considered for these same reasons considering this is a life ending illness with no cure.    CSW discussed some different options and confirmed email address to send resources. CSW also explained that she would speak with LPN, Selena Batten, to check on possible options.    CSW sent the following follow up email with local resources:        Good Morning Diamond Nickel,    It was nice to talk to you this morning and learn more about your and your husband?s situation.    Based on the things we discussed, these resources might be helpful:    [place] Resources:    [employer] and Musician (http://baldwin-leon.info/)    Client Assessment, Referral and Evaluation (CARE) (http://baldwin-leon.info/)    Program of All-Inclusive Care for the Elderly (PACE) (http://baldwin-leon.info/)    In Home Care Resources:    Runner, broadcasting/film/video - [employer] (http://baldwin-leon.info/)    Senior Care Act (SCA) (http://baldwin-leon.info/)      Marge Duncans, [place] Resources:  [employer]  [address] [place], [place] [address]   [phone]  facebook.Primary school teacher Private Duty Home Care:  [facility]     [address], [place], [place] [address]   [phone]  [email or web address]  [address] [place], [place] [address]   [phone]    Another great site to find individual in-home care  takers is:  [email or web address]: Find Child Care, Babysitters, Senior Care, Pet Care and Housekeeping      Please let me know if there is anything else I can assist with!    Thanks,    Sharla Tankard, MSW, LCSW, LSCSW - Clinical Banker of Neurology - [facility]  P: [phone] - E: [email or web address]

## 2019-12-30 ENCOUNTER — Encounter: Admit: 2019-12-30 | Discharge: 2019-12-30 | Payer: MEDICARE

## 2019-12-30 ENCOUNTER — Ambulatory Visit: Admit: 2019-12-30 | Discharge: 2019-12-30 | Payer: MEDICARE

## 2019-12-30 DIAGNOSIS — H547 Unspecified visual loss: Secondary | ICD-10-CM

## 2019-12-30 DIAGNOSIS — F3289 Other specified depressive episodes: Secondary | ICD-10-CM

## 2019-12-30 DIAGNOSIS — G20 Parkinson's disease: Secondary | ICD-10-CM

## 2019-12-30 DIAGNOSIS — R4189 Other symptoms and signs involving cognitive functions and awareness: Secondary | ICD-10-CM

## 2019-12-30 NOTE — Progress Notes
Patient not there.

## 2019-12-31 MED ORDER — ESCITALOPRAM OXALATE 20 MG PO TAB
ORAL_TABLET | Freq: Every day | 3 refills | Status: AC
Start: 2019-12-31 — End: ?

## 2020-01-01 ENCOUNTER — Ambulatory Visit: Admit: 2020-01-01 | Discharge: 2020-01-02 | Payer: MEDICARE

## 2020-01-01 ENCOUNTER — Encounter: Admit: 2020-01-01 | Discharge: 2020-01-01 | Payer: MEDICARE

## 2020-01-01 DIAGNOSIS — R443 Hallucinations, unspecified: Secondary | ICD-10-CM

## 2020-01-01 DIAGNOSIS — R413 Other amnesia: Secondary | ICD-10-CM

## 2020-01-01 DIAGNOSIS — G20 Parkinson's disease: Secondary | ICD-10-CM

## 2020-01-01 DIAGNOSIS — I951 Orthostatic hypotension: Secondary | ICD-10-CM

## 2020-01-01 MED ORDER — QUETIAPINE 25 MG PO TAB
75 mg | ORAL_TABLET | Freq: Two times a day (BID) | ORAL | 1 refills | Status: DC
Start: 2020-01-01 — End: 2020-01-01

## 2020-01-01 MED ORDER — RIVASTIGMINE TARTRATE 3 MG PO CAP
3 mg | ORAL_CAPSULE | Freq: Two times a day (BID) | ORAL | 5 refills | 90.00000 days | Status: AC
Start: 2020-01-01 — End: ?

## 2020-01-01 MED ORDER — QUETIAPINE 25 MG PO TAB
ORAL_TABLET | 1 refills | Status: AC
Start: 2020-01-01 — End: ?

## 2020-01-01 MED ORDER — CARBIDOPA-LEVODOPA 25-100 MG PO TAB
1 | ORAL_TABLET | Freq: Three times a day (TID) | ORAL | 3 refills | Status: DC
Start: 2020-01-01 — End: 2020-01-30

## 2020-01-01 MED ORDER — CARBIDOPA-LEVODOPA 50-200 MG PO TBER
1 | ORAL_TABLET | Freq: Every day | ORAL | 3 refills | Status: AC
Start: 2020-01-01 — End: ?

## 2020-01-03 ENCOUNTER — Encounter: Admit: 2020-01-03 | Discharge: 2020-01-03 | Payer: MEDICARE

## 2020-01-03 NOTE — Progress Notes
Faxed HH order to [facility]

## 2020-01-07 ENCOUNTER — Encounter: Admit: 2020-01-07 | Discharge: 2020-01-07 | Payer: MEDICARE

## 2020-01-07 MED FILL — PIMAVANSERIN 34 MG PO CAP: 34 mg | ORAL | 30 days supply | Qty: 30 | Fill #3 | Status: AC

## 2020-01-10 ENCOUNTER — Encounter: Admit: 2020-01-10 | Discharge: 2020-01-10 | Payer: MEDICARE

## 2020-01-25 ENCOUNTER — Encounter: Admit: 2020-01-25 | Discharge: 2020-01-25 | Payer: MEDICARE

## 2020-01-28 ENCOUNTER — Encounter: Admit: 2020-01-28 | Discharge: 2020-01-28 | Payer: MEDICARE

## 2020-01-28 DIAGNOSIS — G20 Parkinson's disease: Secondary | ICD-10-CM

## 2020-01-30 MED ORDER — CARBIDOPA-LEVODOPA 25-100 MG PO TAB
1 | ORAL_TABLET | Freq: Three times a day (TID) | ORAL | 3 refills | Status: AC
Start: 2020-01-30 — End: ?
  Filled 2020-03-06: qty 60, 6d supply, fill #1

## 2020-02-04 ENCOUNTER — Encounter: Admit: 2020-02-04 | Discharge: 2020-02-04 | Payer: MEDICARE

## 2020-02-04 MED FILL — PIMAVANSERIN 34 MG PO CAP: 34 mg | ORAL | 30 days supply | Qty: 30 | Fill #4 | Status: AC

## 2020-02-06 ENCOUNTER — Encounter: Admit: 2020-02-06 | Discharge: 2020-02-06 | Payer: MEDICARE

## 2020-02-25 ENCOUNTER — Encounter: Admit: 2020-02-25 | Discharge: 2020-02-25 | Payer: MEDICARE

## 2020-02-25 DIAGNOSIS — G20 Parkinson's disease: Secondary | ICD-10-CM

## 2020-02-25 MED ORDER — CARBIDOPA-LEVODOPA 50-200 MG PO TBER
ORAL_TABLET | 3 refills
Start: 2020-02-25 — End: ?

## 2020-03-03 ENCOUNTER — Encounter: Admit: 2020-03-03 | Discharge: 2020-03-03 | Payer: MEDICARE

## 2020-03-03 ENCOUNTER — Ambulatory Visit: Admit: 2020-03-03 | Discharge: 2020-03-04 | Payer: MEDICARE

## 2020-03-03 DIAGNOSIS — G20 Parkinson's disease: Secondary | ICD-10-CM

## 2020-03-03 DIAGNOSIS — H547 Unspecified visual loss: Secondary | ICD-10-CM

## 2020-03-03 DIAGNOSIS — R413 Other amnesia: Secondary | ICD-10-CM

## 2020-03-03 DIAGNOSIS — R4189 Other symptoms and signs involving cognitive functions and awareness: Secondary | ICD-10-CM

## 2020-03-03 MED ORDER — QUETIAPINE 25 MG PO TAB
ORAL_TABLET | 1 refills | Status: AC
Start: 2020-03-03 — End: ?

## 2020-03-03 MED ORDER — INBRIJA 42 MG IN CPDV
ORAL_CAPSULE | 2 refills | Status: CN
Start: 2020-03-03 — End: ?

## 2020-03-03 MED FILL — PIMAVANSERIN 34 MG PO CAP: 34 mg | ORAL | 30 days supply | Qty: 30 | Fill #5 | Status: AC

## 2020-03-03 NOTE — Progress Notes
Telehealth Visit Note    Date of Service: 03/03/2020    Subjective:      Obtained patient's verbal consent to treat them and their agreement to [facility] financial policy and NPP via this telehealth visit during the [facility]    Patient visit was conducted via [employer] using audio/video platform.       Damon Macdonald is a 69 y.o. male.    History of Present Illness    His confusion is better. His falling is better and the last fall was 2 months ago. He is having light headedness. He is having drooling. He denies any hallucination. He needs assistance with ADLs. He c/o in the morning and in the afternoon as far as  OFF times.        Review of Systems      Objective:         ? carbidopa/levodopa (SINEMET) 25/100 mg tablet Take one tablet by mouth three times daily.   ? carbidopa/levodopa CR (SINEMET CR) 50/200 mg tablet Take one tablet by mouth six times daily. At 8a, 11a, 1p, 3p, 6p, bedtime   ? escitalopram oxalate (LEXAPRO) 20 mg tablet TAKE 1 TABLET BY MOUTH EVERY DAY   ? loratadine (CLARITIN) 10 mg tablet Take 10 mg by mouth every morning.   ? LORazepam (ATIVAN) 0.5 mg tablet Take one tablet by mouth at bedtime daily. And 1 tab as needed  Indications: anxious   ? MELATONIN PO Take 5 mg by mouth.   ? meloxicam (MOBIC) 15 mg tablet Take 15 mg by mouth daily.   ? naproxen sodium (ALEVE PO) Take 1 tablet by mouth daily.   ? pimavanserin (NUPLAZID) 34 mg capsule Take one capsule by mouth daily.   ? QUEtiapine (SEROQUEL) 25 mg tablet 1 tab at 12n and 3 tab at-5pm (Patient taking differently: 3 tab at 12n and 1 tab at-5pm)   ? rivastigmine tartrate (EXELON) 3 mg capsule Take one capsule by mouth twice daily.     There were no vitals filed for this visit.  There is no height or weight on file to calculate BMI.     Telehealth Patient Reported Vitals     Row Name 03/03/20 1109                BP:  109/68        BP Source:  Arm, Right Upper        BP Position:  Sitting        Pulse:  66        Weight:  61.2 kg (135 lb)        Height:  174 cm (68.5)        Pain Score:  SEVEN        Pain Location:  FOOT Feet, neck, shoulders               Physical Exam    EXAMINATION:     ORIENTATION: Alert   Cranial Nerves: dysarthria    Right Left   Bradkinesia  Mild Mild   Tremor Hands Resting None None    Postural None None    Kinetic None None           Tremor Other: None  Dyskinesia: None  Muscle Strength: Unremarkable  Gait: Independent but with substantial impairment         Assessment and Plan:    Problem   Parkinson Disease (Hcc)    Symptoms began in 2010, initial  symptoms was change in handwriting. Diagnosed with Parkinson's disease in 2013. Atypical PD Features: Rapid Progression, Tremor Absent and Severe Dysautonomia  10/26/2012 Mentation Score: 8 Activities of Daily Living Score: 28 Motor Exam: 25 Total UPDRS Score: 61   PDQ 39 IMPACT PDQ Total Percent: 58.33 %  01/22/2013 Symptoms got worse when Mirapex (pramipexole) was discontinued.   Got worse when Sinemet (carbidopa/levodopa) 25/100 was reduced  Patient was evaluated by Speech therapist, Mardella Layman Heidrick.    06/18/2018 PDQ8 Total %: 69   02/18/19  PDQ8 Total %: 62   08/20/2019 PDQ8 Total %: 53      Hallucination    06/03/2015 Improved with Nuplazid (pimavanserin)   02/18/19 Off Nuplazid       Memory Change    10/26/2012 MOCA Score (out of 30): 20    08/13/12 MRI brain: Unremarkable  02/13/13 Neuropsychology testing: No evidence of dementia  01/08/2018 MOCA Score (out of 30): 25          Parkinson disease (HCC)  Patient was started on Inbrija (levodopa inhalation powder) and given instructions to use 2 cap up to 5 times a day as needed at the start of an OFF period. Side effects of the medication were discussed and he was given a list of common side effects. Patient will call us for any side effects.      Hallucination  I recommended reducing Seroquel (quetiapine) to 2 tab at noon and 1 tab at 5p.     Memory change  His symptoms are stable. No medication changes were recommended during the current  visit.      Patient will follow up in approximately 3 months.           21 minutes spent on this patient's encounter with counseling and coordination of care taking >50% of the visit.

## 2020-03-03 NOTE — Assessment & Plan Note
His symptoms are stable. No medication changes were recommended during the current  visit.

## 2020-03-03 NOTE — Assessment & Plan Note
I recommended reducing Seroquel (quetiapine) to 2 tab at noon and 1 tab at 5p.

## 2020-03-03 NOTE — Assessment & Plan Note
Patient was started on Inbrija (levodopa inhalation powder) and given instructions to use 2 cap up to 5 times a day as needed at the start of an OFF period. Side effects of the medication were discussed and he was given a list of common side effects. Patient will call us for any side effects.

## 2020-03-04 ENCOUNTER — Encounter: Admit: 2020-03-04 | Discharge: 2020-03-04 | Payer: MEDICARE

## 2020-03-04 DIAGNOSIS — R443 Hallucinations, unspecified: Secondary | ICD-10-CM

## 2020-03-04 NOTE — Progress Notes
The Prior Authorization for Inbrija was submitted for [employer] via Cover My Meds.  Will continue to follow.    Foye Spurling  Pharmacy Patient Advocate  [phone]

## 2020-03-04 NOTE — Progress Notes
The Prior Authorization for Nuplazid was approved for [employer] from 07-24-2019 to 10-21-2020.  The copay is $210.59.     Copay assistance of $3200 was obtained for the patient using grant from [employer] and now the copay is $0.00.  Damon Macdonald has stated this copay is affordable.  The specialty pharmacy will pursue additional copay assistance as necessary.  The specialty pharmacy will reach out to the ambulatory clinical pharmacist if the copay becomes unaffordable.    The medication will be delivered to patient's prescription address per the patient's request.    Foye Spurling  Pharmacy Patient Advocate  [phone]

## 2020-03-05 ENCOUNTER — Encounter: Admit: 2020-03-05 | Discharge: 2020-03-05 | Payer: MEDICARE

## 2020-03-05 NOTE — Progress Notes
Pharmacy Initial Medication Assessment: Inhaled Levodopa (INBRIJA)    Indication / Regimen   Inhaled levodopa oral capsules (INBRIJA inhaler) is being used for the appropriate indication of OFF episodes in Parkinson Disease in patients treated with carbidopa/levodopa.     The regimen of Inbrija 84 mg (42 mg capsule x 2) by oral inhalation as needed (using no more than 5x/day) is planned to continue indefinitely. No renal or hepatic adjustments are required for this regimen. At this time there is no planned dose titration. The patient has the ability to self-administer the medication.     Therapeutic Goal:  Reduction in duration or severity of patient-specific OFF time.      Past Medical History:  Medical History:   Diagnosis Date   ? Disorganized thinking    ? Parkinson disease (HCC)    ? Vision decreased        Labs and Diagnostic tests:  No results found for: NA, K, CL, CO2, GAP, BUN, CR, GLU, CA, PO4, MG, ALBUMIN, TOTPROT, ALKPHOS, AST, ALT, TOTBILI, GFR, GFRAA    Weight:   Wt Readings from Last 1 Encounters:   08/20/19 57.2 kg (126 lb)        Allergies:   Allergies   Allergen Reactions   ? Seasonal Allergies SEE COMMENTS     Runny nose, itchy eyes        Immunizations:    There is no immunization history on file for this patient.  Vaccination history was reviewed.  The patient will be reminded about the importance of receiving an annual influenza vaccine as indicated.       Medication Reconciliation:  Medication reconciliation is based on the patient's most recent med list in the electronic medical record (EMR) including herbal products and OTC medications. The patients' medication list will be updated during patient education, after speaking with the patient and prior to dispensing the medication.    Home Medications    Medication Sig   carbidopa/levodopa (SINEMET) 25/100 mg tablet Take one tablet by mouth three times daily.   carbidopa/levodopa CR (SINEMET CR) 50/200 mg tablet Take one tablet by mouth six times daily. At 8a, 11a, 1p, 3p, 6p, bedtime   escitalopram oxalate (LEXAPRO) 20 mg tablet TAKE 1 TABLET BY MOUTH EVERY DAY   levodopa (INBRIJA) 42 mg inhalation capsules Inhale the contents of two capsules via inhaler into the lungs as directed for OFF episodes. No more than 5 times per day.   loratadine (CLARITIN) 10 mg tablet Take 10 mg by mouth every morning.   LORazepam (ATIVAN) 0.5 mg tablet Take one tablet by mouth at bedtime daily. And 1 tab as needed  Indications: anxious   MELATONIN PO Take 5 mg by mouth.   meloxicam (MOBIC) 15 mg tablet Take 15 mg by mouth daily.   naproxen sodium (ALEVE PO) Take 1 tablet by mouth daily.   pimavanserin (NUPLAZID) 34 mg capsule Take one capsule by mouth daily.   QUEtiapine (SEROQUEL) 25 mg tablet 2 tab at 12n and 1 tab at-5pm   rivastigmine tartrate (EXELON) 3 mg capsule Take one capsule by mouth twice daily.       Drug Interactions:  No new significant drug-drug or drug-food interactions were identified.    Drug-Food Interactions  There are no significant drug-food interactions. This medication should be taken without food.       Safety Precautions:  Pregnancy status: Pregnancy status was assessed and determined to be: Male: education not applicable.    Risk Evaluation  and Mitigation Strategy (REMS) Assessment:  No REMS is required for this medication.     Contraindications: Meagan Walling Hannon has no contraindications to this medication.    Follow up Plan:   Initial therapy assessment has been completed. The patient will be contacted and educated on their regimen. Following education, the patient will be reassessed within 60 days after starting therapy.      [name], PHARMD, BCPS  [facility]  Neurology Department   Phone Number: [phone]  Email: [email or web address]

## 2020-03-05 NOTE — Progress Notes
The Prior Authorization for Damon Macdonald was approved for [employer] from 12-05-2019 to 03-04-2021.  The copay is $52.68. Marland Kitchen    Copay assistance of $3200 was obtained for the patient using grant from [facility] and now the copay is $0.00.  Damon Macdonald has stated this copay is affordable.  The specialty pharmacy will pursue additional copay assistance as necessary.  The specialty pharmacy will reach out to the ambulatory clinical pharmacist if the copay becomes unaffordable.    The ambulatory pharmacist has been notified of the approval in order to provide education prior to dispense of the medication.  Will await notification from the pharmacist that it is OK to set up the fill per the patient's preferred delivery method.    Foye Spurling  Pharmacy Patient Advocate  [phone]

## 2020-03-05 NOTE — Progress Notes
Attempted to call Corey Skains to educate on the specialty medication, Kerney Elbe, that has been approved by insurance. No answer. Left voicemail asking patient to return call to the neurology pharmacist at [phone].    [name], PharmD, BCPS  Neurology Clinic Pharmacist

## 2020-03-06 ENCOUNTER — Encounter: Admit: 2020-03-06 | Discharge: 2020-03-06 | Payer: MEDICARE

## 2020-03-06 NOTE — Progress Notes
Specialty Medication Counseling- Inhaled Levodopa (INBRIJA)    Regimen:  I spoke with Corey Skains / Caregivercontacted via telephone  to provide medication education on their new specialty medication: Inhaled Levodopa (INBRIJA).    Medication Administration:  Corey Skains / Caregiver was educated on the medication: oral inhaled levodopa (INBRJIA). The patient was counseled on the medication name (brand and generic), medication class, indication, dose, route, frequency, and duration. The patient was educated on timely administration of therapy and management of missed doses. An emphasis was placed on patient/caregiver awareness of OFF time/Dose wearing OFF (Parkinson symptoms) vs. Dyskinesias (wiggly, jerky, swaying, bobbing movements). Inhaler does NOT replace carbidopa/levodopa therapy. It is AS NEEDED for OFF time. Rescue/Bridge until next dose of pill is due. 0-30 mins; Lasts ~60 mins    Common side effects were also discussed and may include: cough, discolored sputum (mucus - dark red or black spots), dyskinesia, sleepiness, dizziness, confusion, and/or hallucinations. Adherence with therapy was discussed with the patient. The patient's ability to be adherent with drug therapies was discussed, and the patient was provided options for tools/resources that promote adherence to therapy.     The patient's ability to self-administer medication was assessed. The patient was educated on proper administration/injection technique for their therapy and verbalized acceptance and understanding. Appropriate safe-handling, storage, and disposal directions were reviewed with the patient.     Additional Inbrija Inhaler Counseling Points:   ? May take a small drink of water before and between doses of Inbrija to lessen chance of cough  ? Take 2-3 shorter breaths (2-3 seconds each) with each capsule. Do not breathe in too strongly/deeply.  You may hear a whirling noise of the inhaler, this is normal  ? Inhaler does NOT replace carbidopa/levodopa therapy. Inbrija inhaler is an AS NEEDED therapy for OFF time and is to be used as a rescue/bridge until next dose of pill is due  ? Inbrija ?How to Use? reference video: [email or web address]      Medication Reconciliation:  A medication history and reconciliation were performed (including prescription medications, supplements, over the counter, and herbal products). The medication list was updated, and the patient's current medication list is listed below.    Home Medications    Medication Sig   carbidopa/levodopa (SINEMET) 25/100 mg tablet Take one tablet by mouth three times daily.   carbidopa/levodopa CR (SINEMET CR) 50/200 mg tablet Take one tablet by mouth six times daily. At 8a, 11a, 1p, 3p, 6p, bedtime   escitalopram oxalate (LEXAPRO) 20 mg tablet TAKE 1 TABLET BY MOUTH EVERY DAY   levodopa (INBRIJA) 42 mg inhalation capsules Inhale the contents of two capsules via inhaler into the lungs as directed for OFF episodes. No more than 5 times per day.   loratadine (CLARITIN) 10 mg tablet Take 10 mg by mouth every morning.   LORazepam (ATIVAN) 0.5 mg tablet Take one tablet by mouth at bedtime daily. And 1 tab as needed  Indications: anxious   MELATONIN PO Take 5 mg by mouth.   meloxicam (MOBIC) 15 mg tablet Take 15 mg by mouth daily.   naproxen sodium (ALEVE PO) Take 1 tablet by mouth daily.   pimavanserin (NUPLAZID) 34 mg capsule Take one capsule by mouth daily.   QUEtiapine (SEROQUEL) 25 mg tablet 2 tab at 12n and 1 tab at-5pm   rivastigmine tartrate (EXELON) 3 mg capsule Take one capsule by mouth twice daily.       Drug-drug and drug-food interactions with the new therapy were assessed  and reviewed with the patient. The patient was instructed to speak with their health care provider before starting any new drug, including prescription or over the counter, natural products, or vitamins.    Appropriate recommended vaccinations were reviewed and discussed with the patient.    Safety Precautions:  Contraindications: Virgel Gess Dyke has no contraindications to this medication.  Risk Evaluation and Mitigation Strategy (REMS) Assessment:  No REMS is required for this medication.   Pregnancy status: Pregnancy status was assessed and determined to be: Male: education not applicable.    The patient was instructed to seek medical attention immediately if they experience signs of an allergic reaction, including but not limited to: rash; hives; itching; red, swollen, blistered, or peeling skin with or without fever.    Pharmacy Information:   The medication will be filled at [facility] [place] [facility] specialty pharmacy.  [facility] specialty pharmacy will ship the medication to the discussed shipping address and the patient should receive the medication within 2-3 days. . The patient was educated on the refill process. [facility] specialty pharmacy will call the patient 5-7 days prior to the patient completing their current supply of medication to coordinate a refill. The patient was provided the specialty pharmacy phone number [phone]) for reference. The patient was educated on the importance of adherence and to remain compliant with the refill process..           Follow Up:  The monitoring and follow-up plan were discussed with the patient/caregiver. The patient was instructed to contact their health care provider if their symptoms or health problems do not get better or if they become worse.  Virgel Gess Skates (and/or caregiver where applicable) was given the opportunity to ask questions and did not have any questions at this time.. The patient was encouraged to call the neurology pharmacist at [phone] with any questions. The patient will be contacted by clinical pharmacist within 60 days after medication is dispensed for a medication reassessment.     [name], PHARMD, BCPS  [facility]  Neurology Department   Phone Number: [phone]  Email: [email or web address]

## 2020-03-20 ENCOUNTER — Encounter: Admit: 2020-03-20 | Discharge: 2020-03-20 | Payer: MEDICARE

## 2020-03-20 DIAGNOSIS — I951 Orthostatic hypotension: Secondary | ICD-10-CM

## 2020-03-20 DIAGNOSIS — R443 Hallucinations, unspecified: Secondary | ICD-10-CM

## 2020-03-20 MED ORDER — QUETIAPINE 25 MG PO TAB
ORAL_TABLET | 3 refills | Status: AC
Start: 2020-03-20 — End: ?

## 2020-03-20 MED ORDER — FLUDROCORTISONE 0.1 MG PO TAB
ORAL_TABLET | Freq: Every day | 3 refills
Start: 2020-03-20 — End: ?

## 2020-03-20 NOTE — Telephone Encounter
[facility] pharmacy requesting Quetiapine refills. Last seen 03/03/2020. Medication included in plan of care. Rx refill sent.

## 2020-03-30 ENCOUNTER — Encounter: Admit: 2020-03-30 | Discharge: 2020-03-30 | Payer: MEDICARE

## 2020-03-30 MED ORDER — LORAZEPAM 0.5 MG PO TAB
ORAL_TABLET | Freq: Every evening | 5 refills | PRN
Start: 2020-03-30 — End: ?

## 2020-03-30 MED FILL — PIMAVANSERIN 34 MG PO CAP: 34 mg | ORAL | 30 days supply | Qty: 30 | Fill #6 | Status: AC

## 2020-04-09 ENCOUNTER — Encounter: Admit: 2020-04-09 | Discharge: 2020-04-09 | Payer: MEDICARE

## 2020-04-14 ENCOUNTER — Encounter: Admit: 2020-04-14 | Discharge: 2020-04-14 | Payer: MEDICARE

## 2020-04-14 DIAGNOSIS — G20 Parkinson's disease: Secondary | ICD-10-CM

## 2020-04-14 MED ORDER — CARBIDOPA-LEVODOPA 50-200 MG PO TBER
ORAL_TABLET | 1 refills | Status: AC
Start: 2020-04-14 — End: ?

## 2020-04-14 NOTE — Progress Notes
Contacted patient's wife to inform that Dr. Erline Levine is agreeable to increase sinemet CR dose at 8a and 3p to 1.5 tabs; all other doses will remain the same. New Rx sent to patient's preferred pharmacy. All other questions answered.    [name], PharmD, BCPS  Neurology Clinic Pharmacist

## 2020-04-24 ENCOUNTER — Encounter: Admit: 2020-04-24 | Discharge: 2020-04-24 | Payer: MEDICARE

## 2020-04-24 NOTE — Progress Notes
Copay assistance of $3200 was obtained for the specialty medication Nuplazid using grant from [employer] and now the copay is $0.  Damon Macdonald has stated this copay is affordable.  The specialty pharmacy will pursue additional copay assistance as necessary.  The specialty pharmacy will reach out to the ambulatory clinical pharmacist if the copay becomes unaffordable.    The medication will be delivered to patient's prescription address per the patient's request.    Fredda Hammed  Pharmacy Patient Advocate  [phone]

## 2020-04-26 MED FILL — PIMAVANSERIN 34 MG PO CAP: 34 mg | ORAL | 30 days supply | Qty: 30 | Fill #7 | Status: AC

## 2020-05-25 ENCOUNTER — Encounter: Admit: 2020-05-25 | Discharge: 2020-05-25 | Payer: MEDICARE

## 2020-05-25 MED FILL — PIMAVANSERIN 34 MG PO CAP: 34 mg | ORAL | 30 days supply | Qty: 30 | Fill #8 | Status: AC

## 2020-06-23 ENCOUNTER — Encounter: Admit: 2020-06-23 | Discharge: 2020-06-23 | Payer: MEDICARE

## 2020-06-23 MED FILL — PIMAVANSERIN 34 MG PO CAP: 34 mg | ORAL | 30 days supply | Qty: 30 | Fill #9 | Status: AC

## 2020-07-20 ENCOUNTER — Encounter: Admit: 2020-07-20 | Discharge: 2020-07-20 | Payer: MEDICARE

## 2020-07-21 ENCOUNTER — Encounter: Admit: 2020-07-21 | Discharge: 2020-07-21 | Payer: MEDICARE

## 2020-07-21 DIAGNOSIS — R413 Other amnesia: Secondary | ICD-10-CM

## 2020-07-21 MED ORDER — RIVASTIGMINE TARTRATE 3 MG PO CAP
3 mg | ORAL_CAPSULE | Freq: Two times a day (BID) | ORAL | 5 refills
Start: 2020-07-21 — End: ?

## 2020-07-21 MED FILL — PIMAVANSERIN 34 MG PO CAP: 34 mg | ORAL | 30 days supply | Qty: 30 | Fill #10 | Status: AC

## 2020-08-11 ENCOUNTER — Encounter: Admit: 2020-08-11 | Discharge: 2020-08-11 | Payer: MEDICARE

## 2020-08-25 ENCOUNTER — Encounter: Admit: 2020-08-25 | Discharge: 2020-08-25 | Payer: MEDICARE

## 2020-08-25 MED FILL — PIMAVANSERIN 34 MG PO CAP: 34 mg | ORAL | 30 days supply | Qty: 30 | Fill #11 | Status: AC

## 2020-09-02 ENCOUNTER — Ambulatory Visit: Admit: 2020-09-02 | Discharge: 2020-09-03 | Payer: MEDICARE

## 2020-09-02 ENCOUNTER — Encounter: Admit: 2020-09-02 | Discharge: 2020-09-02 | Payer: MEDICARE

## 2020-09-02 DIAGNOSIS — I951 Orthostatic hypotension: Secondary | ICD-10-CM

## 2020-09-02 DIAGNOSIS — R413 Other amnesia: Secondary | ICD-10-CM

## 2020-09-02 DIAGNOSIS — G20 Parkinson's disease: Principal | ICD-10-CM

## 2020-09-02 DIAGNOSIS — G4709 Other insomnia: Secondary | ICD-10-CM

## 2020-09-02 DIAGNOSIS — R4189 Other symptoms and signs involving cognitive functions and awareness: Secondary | ICD-10-CM

## 2020-09-02 DIAGNOSIS — R443 Hallucinations, unspecified: Secondary | ICD-10-CM

## 2020-09-02 DIAGNOSIS — H547 Unspecified visual loss: Secondary | ICD-10-CM

## 2020-09-02 DIAGNOSIS — F3289 Other specified depressive episodes: Secondary | ICD-10-CM

## 2020-09-02 MED ORDER — CARBIDOPA-LEVODOPA 50-200 MG PO TBER
ORAL_TABLET | 1 refills | Status: AC
Start: 2020-09-02 — End: ?

## 2020-09-02 MED ORDER — CARBIDOPA-LEVODOPA 25-100 MG PO TAB
1 | ORAL_TABLET | Freq: Every day | ORAL | 3 refills | Status: AC
Start: 2020-09-02 — End: ?

## 2020-09-02 NOTE — Assessment & Plan Note
His symptoms are stable. No medication changes were recommended during the current  visit.

## 2020-09-02 NOTE — Progress Notes
Clinical Pharmacist Note - [facility]    Damon Macdonald is a 70 y.o. male . Patient was seen today after visit with Dr. 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 The regimen of 34mg  by mouth once daily is planned to continue indefinitely which is appropriate for [employer] No renal or hepatic adjustments are required.  . At this time there is no planned dose titration.     Hallucinations/PD Psychosis Reassessment: (Patient or Caregiver/proxy)  ? Over the past week have you seen, heard, smelled or felt things that were not there?: 0: Normal: No hallucinations or psychotic behavior.    ? How frequently do you see or hear things (visual and/or auditory hallucinations) that are not there?: 0.Never    ? Can you tell that these things (hallucinations, delusions) are not real?: 0. I am not having them now.    ? When you have the hallucinations, do the hallucinations threaten you in any way? This means the actions against you, not just the fact they are there.: 0. I don't have any hallucinations now    Therapeutic Goals:  -Initial reduction in severity and/or frequency of hallucinations and/or delusions after at least 8 weeks of therapy:   As the patient is achieving therapeutic benefit from the therapy, the plan is to continue current therapy.      Labs and Diagnostic tests:  Labs:  No results found for: NA, K, CL, CO2, GAP, BUN, CR, GLU, CA, PO4, MG, ALBUMIN, TOTPROT, ALKPHOS, AST, ALT, TOTBILI, GFR, GFRAA    Baseline ECG  No recent ECG.    Allergies:   Allergies   Allergen Reactions   ? Seasonal Allergies SEE COMMENTS     Runny nose, itchy eyes        Immunizations:  Immunization History   Administered Date(s) Administered   ? COVID-19 (MODERNA), mRNA vacc, 100 mcg/0.5 mL (PF) 07/30/2019, 08/27/2019, 05/20/2020     Vaccination history was reviewed. The patient was reminded about the importance of receiving an annual influenza vaccine as indicated.       Medication Reconciliation:  A medication history and reconciliation were performed (including prescription medications, supplements, over the counter, and herbal products). The medication list was updated and the patient's current medication list is included below.     Home Medications    Medication Sig   carbidopa-levodopa CR (SINEMET CR) 50/200 mg tablet Take 1 tabs by mouth at 8a, 0.5 tab at 9a, 1 at 11a, 1p, 3p, 4p, 1 tab at 6p, bedtime  Indications: Parkinson's disease   carbidopa/levodopa (SINEMET) 25/100 mg tablet Take one tablet by mouth daily. 7a   escitalopram oxalate (LEXAPRO) 20 mg tablet TAKE 1 TABLET BY MOUTH EVERY DAY   loratadine (CLARITIN) 10 mg tablet Take 10 mg by mouth every morning.   LORazepam (ATIVAN) 0.5 mg tablet TAKE ONE TABLET BY MOUTH AT BEDTIME AND ONE TABLET DAILY AS NEEDED FOR ANXIETY   MELATONIN PO Take 5 mg by mouth.   meloxicam (MOBIC) 15 mg tablet Take 15 mg by mouth daily.   naproxen sodium (ALEVE PO) Take 1 tablet by mouth daily.   pimavanserin (NUPLAZID) 34 mg capsule Take one capsule by mouth daily.   QUEtiapine (SEROQUEL) 25 mg tablet 2 tab at 12n and 1 tab at-5pm   rivastigmine  tartrate (EXELON) 3 mg capsule TAKE ONE CAPSULE BY MOUTH TWICE DAILY       Drug-drug and drug-food interactions between the patients' specialty medication and their medication list were assessed and reviewed with the patient. The patient was instructed to speak with their health care provider before starting any new drugs, including prescription or over the counter, natural / herbal products, or vitamins.    Drug Interactions:  The following drug-drug interactions were identified: quetiapine and pimavanserin and they will be managed by patient stable on current regimen. Recommend EKG if regimen requires adjustment.     Drug-Food Interactions  Patient should limit/avoid frequent use of grapefruit. This medication can be taken with or without food.    Adverse effects:  Damon Macdonald is not experiencing any significant adverse effects to this medication regimen.    Adherence:  Refill and adherence history were reviewed with the patient. The patient is adherent with refills and is meeting their refill goal. They report no missed doses over the past 30 days.  The patient is meeting their adherence goal. The patient was reminded about the refill process and re-educated on the importance of adherence.  Overall medication adherence status will be discussed with the care team as deemed appropriate.    Safety Precautions:  Contraindications: Damon Macdonald has no contraindications to this medication.  Risk Evaluation and Mitigation Strategy (REMS) Assessment:  No REMS is required for this medication.   Pregnancy status: Pregnancy status was assessed and determined to be: Male: education not applicable..      Follow Up Plan:   Damon Macdonald/Caregiver was given the opportunity to ask questions and did not have any questions at this time. The patient was reminded of the refill process as discussed during the medication education.  Damon Macdonald/Caregiver was encouraged to call the Neurology pharmacist at [phone] with questions. The patient will be contacted to complete a medication reassessment within 1 year.    [name], PHARMD, BCPS  [facility]  Neurology Department   Phone Number: [phone]  Email: [email or web address]

## 2020-09-02 NOTE — Assessment & Plan Note
Symptoms are worse since the last visit. I recommended changing Sinemet (carbidopa/levodopa) CR 50/200 1 tab at 8a, 0.5 tab at 9a, 1 at 11a, 1p, 3p, 4p, 1 tab at 6p, bedtime

## 2020-09-02 NOTE — Progress Notes
Date of Service: 09/02/2020     Subjective:             Damon Macdonald is a 70 y.o. male.    History of Present Illness     Follow-Up Visit    Since my last visit I am: Slighly worse.    Symptoms Scale:    Memory problems: Mild, bothersome  Hallucinations/delusions: Slight, sense of presence  Depression: Mild, occurs due to a reason  Anxiety: Mild, can be bothersome  Apathy: Slight, some loss of interest  Impulsive behavior: Mild, affects family (pornography)  Nighttime sleep: Marked, difficulty falling and staying asleep  Daytime sleepiness: Moderate, difficulty staying awake  Vivid dreams: Slight, occasional  REM sleep behavior disorder: Slight, rarely  Restless leg syndrome: Marked, frequently affects sleeping  Pain or muscle cramps: Marked, limits my activities  Urination: Moderate, occasional accidents  Constipation: Marked, need prescription medication  Dizziness or lightheadedness: Mild, often on standing  Tiredness/Fatigue: Mild, occasionally tired  Falling: Moderate, multiple times a month  Personal care assistance: Mild, I have some difficulty and occasionally need help, but don't need help daily or regularly    Total Score:  Total Score: 43    Assistive Devices:  Assistive devices for getting around: Cane    OFF Time:    OFF time: Yes  Hours/day of OFF time: 3  Number of episodes/day: 1  OFF time is bothersome: All of the time  Muscles spasms or strange posturing: Yes  OFF disability: They are disabling to me    Dyskinesia:    Dyskinesia while awake: Yes  Hours/day of dyskinesia: 4  Number of episodes/day: 4  Dyskinesia disability: They are disabling to me    Employment Status:    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    Gastrointestinal: Negative.    Genitourinary: Positive for urgency. Negative for decreased urine volume, difficulty urinating, dysuria, enuresis, flank pain, frequency, genital sores, hematuria, penile discharge, penile pain, penile swelling, scrotal swelling and testicular pain.   Neurological: Positive for speech difficulty. Negative for tremors.         Objective:         ? carbidopa-levodopa CR (SINEMET CR) 50/200 mg tablet Take 1 tabs by mouth at 8a, 0.5 tab at 9a, 1 at 11a, 1p, 3p, 4p, 1 tab at 6p, bedtime  Indications: Parkinson's disease   ? carbidopa/levodopa (SINEMET) 25/100 mg tablet Take one tablet by mouth daily. 7a   ? escitalopram oxalate (LEXAPRO) 20 mg tablet TAKE 1 TABLET BY MOUTH EVERY DAY   ? loratadine (CLARITIN) 10 mg tablet Take 10 mg by mouth every morning.   ? LORazepam (ATIVAN) 0.5 mg tablet TAKE ONE TABLET BY MOUTH AT BEDTIME AND ONE TABLET DAILY AS NEEDED FOR ANXIETY   ? MELATONIN PO Take 5 mg by mouth.   ? meloxicam (MOBIC) 15 mg tablet Take 15 mg by mouth daily.   ? naproxen sodium (ALEVE PO) Take 1 tablet by mouth daily.   ? pimavanserin (NUPLAZID) 34 mg capsule Take one capsule by mouth daily.   ? QUEtiapine (SEROQUEL) 25 mg tablet 2 tab at 12n and 1 tab at-5pm   ? rivastigmine tartrate (EXELON) 3 mg capsule TAKE ONE CAPSULE BY MOUTH TWICE DAILY     Vitals:    09/02/20 0934 09/02/20 0955   BP: 116/75 101/70   BP Source: Arm, Left Upper  Patient Position: Sitting Standing   Pulse: 72 77   Weight: 63.9 kg (140 lb 12.8 oz)    Height: 172.7 cm (5' 8)    PainSc: Seven      Body mass index is 21.41 kg/m?Marland Kitchen     Physical Exam    EXAMINATION:     ORIENTATION: Alert and Oriented.    Cranial Nerves:  reduced facial expression and soft voice.      Right Left   Bradkinesia  Moderate Mild   Tremor Hands Resting None None    Postural None None    Kinetic None None           Tremor Other: None  Dyskinesia: None  Muscle Strength: Unremarkable  Gait: Uses cane to ambulate    PDQ8 - Quality of Life  Had difficulty getting around in public places?: Often   Had difficulty dressing?: Always   Felt depressed?: Always   Had problems with your close personal relationships?: Always     Had problems with your concentration, for example, when reading or watching TV?: Always   Felt unable to communicate effectively?: Often   Had painful muscle cramps or spasms?: Always   Felt embarrassed in public due to having Parkinson's disease?: Ocassionally              PDQ8 Total Score (32 Possible): 27  PDQ8 Total %: 84          Falls Risk Score:   13  High risk of falls         Timed Up and Go with Pushing: 31 Seconds (with cane)     Geriatric Depression Scale: 21 severe depression                Assessment and Plan:    Problem   Parkinson Disease (Hcc)    Symptoms began in 2010, initial symptoms was change in handwriting. Diagnosed with Parkinson's disease in 2013. Atypical PD Features: Rapid Progression, Tremor Absent and Severe Dysautonomia  10/26/2012 Mentation Score: 8 Activities of Daily Living Score: 28 Motor Exam: 25 Total UPDRS Score: 61   PDQ 39 IMPACT PDQ Total Percent: 58.33 %  01/22/2013 Symptoms got worse when Mirapex (pramipexole) was discontinued.   Got worse when Sinemet (carbidopa/levodopa) 25/100 was reduced  Patient was evaluated by Speech therapist, Mardella Layman Heidrick.    02/18/19  PDQ8 Total %: 62   08/20/2019 PDQ8 Total %: 53   09/02/2020 PDQ8 Total %: 84 Side effects to Inbrija (levodopa inhalation powder) coughing      Hallucination    06/03/2015 Improved with Nuplazid (pimavanserin)   09/02/2020 On Seroquel (quetiapine)        Insomnia    02/18/19 On lorazepam and seroquel     Depression    10/26/2012 Geriatric Depression Scale: 15    01/22/2013 Geriatric Depression Scale: 16  On Lexapro (escitalopram)   06/24/2013 On Wellbutrin (bupropion)   01/08/2018 Wellbutrin (bupropion) was discontinued by Primary Care Physician   02/18/19: Geriatric Depression Scale: 18   09/02/2020 Geriatric Depression Scale: 21         Memory Change    10/26/2012 MOCA Score (out of 30): 20    08/13/12 MRI brain: Unremarkable  02/13/13 Neuropsychology testing: No evidence of dementia  01/08/2018 MOCA Score (out of 30): 25   09/02/2020 On Exelon (rivastigmine)      Orthostatic Hypotension    10/2014: florinef started. Patient reports significant improvement since starting.   06/18/2018 On Florinef (fludrocortisone)  Parkinson disease (HCC)  Symptoms are worse since the last visit. I recommended changing Sinemet (carbidopa/levodopa) CR 50/200 1 tab at 8a, 0.5 tab at 9a, 1 at 11a, 1p, 3p, 4p, 1 tab at 6p, bedtime    Depression  His symptoms are stable. No medication changes were recommended during the current  visit.      Hallucination  His symptoms are stable. No medication changes were recommended during the current  visit.      Memory change  His symptoms are stable. No medication changes were recommended during the current  visit.      Insomnia  His symptoms are stable. No medication changes were recommended during the current  visit.      Orthostatic hypotension  His symptoms are stable. No medication changes were recommended during the current  visit.      Patient will follow up in approximately 6 months.

## 2020-09-09 ENCOUNTER — Encounter: Admit: 2020-09-09 | Discharge: 2020-09-09 | Payer: MEDICARE

## 2020-09-09 NOTE — Progress Notes
Received home health certification and plan of care orders from [facility]. Signed by Dr Erline Levine and faxed to [phone]. Received fax confirmation at 4:45pm. Given to medical records to be scanned into patient's chart.

## 2020-09-10 ENCOUNTER — Encounter: Admit: 2020-09-10 | Discharge: 2020-09-10 | Payer: MEDICARE

## 2020-09-16 ENCOUNTER — Encounter: Admit: 2020-09-16 | Discharge: 2020-09-16 | Payer: MEDICARE

## 2020-09-16 NOTE — Progress Notes
Received home health orders dated 09/15/20 from [facility]. Signed by Dr Erline Levine and faxed to [phone]. Received fax confirmation at 4:12pm. Given to medical records to be scanned into patient's chart.

## 2020-09-24 ENCOUNTER — Encounter: Admit: 2020-09-24 | Discharge: 2020-09-24 | Payer: MEDICARE

## 2020-09-24 MED FILL — PIMAVANSERIN 34 MG PO CAP: 34 mg | ORAL | 30 days supply | Qty: 30 | Fill #12 | Status: AC

## 2020-09-29 ENCOUNTER — Encounter: Admit: 2020-09-29 | Discharge: 2020-09-29 | Payer: MEDICARE

## 2020-09-29 MED ORDER — LORAZEPAM 0.5 MG PO TAB
ORAL_TABLET | Freq: Every evening | ORAL | 5 refills | 12.00000 days | Status: AC | PRN
Start: 2020-09-29 — End: ?

## 2020-10-02 ENCOUNTER — Encounter: Admit: 2020-10-02 | Discharge: 2020-10-02 | Payer: MEDICARE

## 2020-10-14 ENCOUNTER — Encounter: Admit: 2020-10-14 | Discharge: 2020-10-14 | Payer: MEDICARE

## 2020-10-14 DIAGNOSIS — G20 Parkinson's disease: Secondary | ICD-10-CM

## 2020-10-14 DIAGNOSIS — R443 Hallucinations, unspecified: Secondary | ICD-10-CM

## 2020-10-14 MED ORDER — NUPLAZID 34 MG PO CAP
34 mg | ORAL_CAPSULE | Freq: Every day | ORAL | 11 refills | Status: CN
Start: 2020-10-14 — End: ?

## 2020-10-14 NOTE — Telephone Encounter
Received refill request for Nuplazid. Medication included in 09/02/20 OV plan of care. Next OV scheduled for 03/16/21. Rx refilled electronically. Dr. Erline Levine to [place].

## 2020-10-21 ENCOUNTER — Encounter: Admit: 2020-10-21 | Discharge: 2020-10-21 | Payer: MEDICARE

## 2020-10-21 MED FILL — NUPLAZID 34 MG PO CAP: 34 mg | ORAL | 30 days supply | Qty: 30 | Fill #1 | Status: AC

## 2020-11-11 ENCOUNTER — Encounter: Admit: 2020-11-11 | Discharge: 2020-11-11 | Payer: MEDICARE

## 2020-11-11 NOTE — Progress Notes
Received Home Health orders from [facility]. Signed by Dr Erline Levine and faxed to [phone]. Received fax confirmation at 2:04 pm. Given to medical records to be scanned into patient's chart.

## 2020-11-19 ENCOUNTER — Encounter: Admit: 2020-11-19 | Discharge: 2020-11-19 | Payer: MEDICARE

## 2020-11-19 MED FILL — NUPLAZID 34 MG PO CAP: 34 mg | ORAL | 30 days supply | Qty: 30 | Fill #2 | Status: AC

## 2020-11-19 NOTE — Progress Notes
Received home health orders from [facility]. Signed by Dr Erline Levine and faxed to [phone]. Received fax confirmation at 2:44pm. Given to medical records to be scanned into patient's chart.

## 2020-12-11 ENCOUNTER — Encounter: Admit: 2020-12-11 | Discharge: 2020-12-11 | Payer: MEDICARE

## 2020-12-11 DIAGNOSIS — G20 Parkinson's disease: Secondary | ICD-10-CM

## 2020-12-11 MED ORDER — CARBIDOPA-LEVODOPA 25-100 MG PO TAB
1 | ORAL_TABLET | Freq: Every morning | ORAL | 3 refills | Status: AC
Start: 2020-12-11 — End: ?

## 2020-12-11 NOTE — Telephone Encounter
Received refill request for Sinemet IR. Medication included in 09/02/20 LOV plan of care. Next OV scheduled for 03/16/21. Rx refilled electronically. Dr. Erline Levine to [place].     Dr.Pahwa please clarify current refill is set for 1 tab tid and your last note indicate 1 tab qday. Nothing noted in progress notes in between for a dose change.

## 2020-12-18 ENCOUNTER — Encounter: Admit: 2020-12-18 | Discharge: 2020-12-18 | Payer: MEDICARE

## 2020-12-20 MED FILL — NUPLAZID 34 MG PO CAP: 34 mg | ORAL | 30 days supply | Qty: 30 | Fill #3 | Status: AC

## 2021-01-01 ENCOUNTER — Encounter: Admit: 2021-01-01 | Discharge: 2021-01-01 | Payer: MEDICARE

## 2021-01-01 NOTE — Telephone Encounter
Therapist reported that pt has been having more falls, more fatigued and weak. Would like to have any suggestions from Dr.Pahwa

## 2021-01-07 ENCOUNTER — Encounter: Admit: 2021-01-07 | Discharge: 2021-01-07 | Payer: MEDICARE

## 2021-01-07 NOTE — Telephone Encounter
Signed HH Cert and POC faxed to [employer] [facility] at [phone]. Confirmation page received at 10:14 am

## 2021-01-18 ENCOUNTER — Encounter: Admit: 2021-01-18 | Discharge: 2021-01-18 | Payer: MEDICARE

## 2021-01-18 DIAGNOSIS — F3289 Other specified depressive episodes: Secondary | ICD-10-CM

## 2021-01-18 MED ORDER — ESCITALOPRAM OXALATE 20 MG PO TAB
ORAL_TABLET | Freq: Every day | 3 refills | Status: AC
Start: 2021-01-18 — End: ?

## 2021-01-18 MED FILL — NUPLAZID 34 MG PO CAP: 34 mg | ORAL | 30 days supply | Qty: 30 | Fill #4 | Status: AC

## 2021-01-18 NOTE — Telephone Encounter
Received refill request for Lexapro. Medication included in 09/02/20 LOV plan of care. Next OV scheduled for 03/16/21. Rx refilled electronically. Dr. Erline Levine to [place].

## 2021-02-05 ENCOUNTER — Encounter: Admit: 2021-02-05 | Discharge: 2021-02-05 | Payer: MEDICARE

## 2021-02-05 DIAGNOSIS — G20 Parkinson's disease: Secondary | ICD-10-CM

## 2021-02-05 MED ORDER — CARBIDOPA-LEVODOPA 50-200 MG PO TBER
ORAL_TABLET | 3 refills | Status: AC
Start: 2021-02-05 — End: ?

## 2021-02-05 NOTE — Telephone Encounter
Received refill request for Sinemet CR. Medication included in 09/02/20 LOV plan of care. Next OV scheduled for 03/16/21. Rx refilled electronically. Dr. Erline Levine to [place].

## 2021-02-17 ENCOUNTER — Encounter: Admit: 2021-02-17 | Discharge: 2021-02-17 | Payer: MEDICARE

## 2021-02-17 MED FILL — NUPLAZID 34 MG PO CAP: 34 mg | ORAL | 30 days supply | Qty: 30 | Fill #5 | Status: AC

## 2021-03-04 ENCOUNTER — Encounter: Admit: 2021-03-04 | Discharge: 2021-03-04 | Payer: MEDICARE

## 2021-03-11 ENCOUNTER — Encounter: Admit: 2021-03-11 | Discharge: 2021-03-11 | Payer: MEDICARE

## 2021-03-11 NOTE — Progress Notes
Received home health orders dated 02/25/21 from [facility]. Signed by Dr Erline Levine and faxed to [phone]. Received fax confirmation at 9:35am. Given to medical records to be scanned into patient's chart.

## 2021-03-15 ENCOUNTER — Encounter: Admit: 2021-03-15 | Discharge: 2021-03-15 | Payer: MEDICARE

## 2021-03-15 NOTE — Telephone Encounter
Called back to pts wife. Offered either 1015 am or 3 pm app-t instead of their schedule done per their request. If not able to reach me before I leave advised to give a call to Damon Macdonald first thing in am.

## 2021-03-16 ENCOUNTER — Encounter: Admit: 2021-03-16 | Discharge: 2021-03-16 | Payer: MEDICARE

## 2021-03-29 ENCOUNTER — Encounter: Admit: 2021-03-29 | Discharge: 2021-03-29 | Payer: MEDICARE

## 2021-03-29 DIAGNOSIS — R443 Hallucinations, unspecified: Secondary | ICD-10-CM

## 2021-03-29 MED ORDER — QUETIAPINE 25 MG PO TAB
ORAL_TABLET | Freq: Every day | 5 refills
Start: 2021-03-29 — End: ?

## 2021-03-29 MED ORDER — LORAZEPAM 0.5 MG PO TAB
ORAL_TABLET | Freq: Every evening | 5 refills | PRN
Start: 2021-03-29 — End: ?

## 2021-05-06 ENCOUNTER — Encounter: Admit: 2021-05-06 | Discharge: 2021-05-06 | Payer: MEDICARE

## 2021-05-06 NOTE — Telephone Encounter
Created in error

## 2021-05-06 NOTE — Telephone Encounter
Received HH Cert &POC  orders from [employer]. Signed by Dr Erline Levine and faxed to [phone]. Received fax confirmation at [phone]. Given to medical records to be scanned into patient's chart.

## 2021-05-18 ENCOUNTER — Encounter: Admit: 2021-05-18 | Discharge: 2021-05-18 | Payer: MEDICARE

## 2021-05-18 MED ORDER — LORAZEPAM 0.5 MG PO TAB
ORAL_TABLET | Freq: Every evening | 1 refills | PRN
Start: 2021-05-18 — End: ?

## 2021-05-18 NOTE — Telephone Encounter
Received refill request for Lorazepam. Medication included in 09/02/20 LOV plan of care. Next OV scheduled for 07/14/21. Rx refilled electronically. Dr. Erline Levine to [place].

## 2021-05-19 ENCOUNTER — Encounter: Admit: 2021-05-19 | Discharge: 2021-05-19 | Payer: MEDICARE

## 2021-05-20 ENCOUNTER — Encounter: Admit: 2021-05-20 | Discharge: 2021-05-20 | Payer: MEDICARE

## 2021-05-20 MED FILL — NUPLAZID 34 MG PO CAP: 34 mg | ORAL | 30 days supply | Qty: 30 | Fill #6 | Status: AC

## 2021-06-11 ENCOUNTER — Encounter: Admit: 2021-06-11 | Discharge: 2021-06-11 | Payer: MEDICARE

## 2021-06-15 ENCOUNTER — Encounter: Admit: 2021-06-15 | Discharge: 2021-06-15 | Payer: MEDICARE

## 2021-06-15 MED FILL — NUPLAZID 34 MG PO CAP: 34 mg | ORAL | 30 days supply | Qty: 30 | Fill #7 | Status: AC

## 2021-06-26 ENCOUNTER — Encounter: Admit: 2021-06-26 | Discharge: 2021-06-26 | Payer: MEDICARE

## 2021-06-26 DIAGNOSIS — R443 Hallucinations, unspecified: Secondary | ICD-10-CM

## 2021-06-26 MED ORDER — QUETIAPINE 25 MG PO TAB
ORAL_TABLET | Freq: Every day | 0 refills
Start: 2021-06-26 — End: ?

## 2021-07-12 ENCOUNTER — Encounter: Admit: 2021-07-12 | Discharge: 2021-07-12 | Payer: MEDICARE

## 2021-07-12 NOTE — Telephone Encounter
LV to pts wife confirming pts app-t on 1/25 was changed to telehealth d/t transportation issues. Wife verbalized understanding.

## 2021-07-14 ENCOUNTER — Ambulatory Visit: Admit: 2021-07-14 | Discharge: 2021-07-15 | Payer: MEDICARE

## 2021-07-14 ENCOUNTER — Encounter: Admit: 2021-07-14 | Discharge: 2021-07-14 | Payer: MEDICARE

## 2021-07-14 DIAGNOSIS — R443 Hallucinations, unspecified: Secondary | ICD-10-CM

## 2021-07-14 DIAGNOSIS — F419 Anxiety disorder, unspecified: Secondary | ICD-10-CM

## 2021-07-14 DIAGNOSIS — G20 Parkinson's disease: Secondary | ICD-10-CM

## 2021-07-14 DIAGNOSIS — H547 Unspecified visual loss: Secondary | ICD-10-CM

## 2021-07-14 DIAGNOSIS — R413 Other amnesia: Secondary | ICD-10-CM

## 2021-07-14 DIAGNOSIS — I951 Orthostatic hypotension: Secondary | ICD-10-CM

## 2021-07-14 DIAGNOSIS — F3289 Other specified depressive episodes: Secondary | ICD-10-CM

## 2021-07-14 DIAGNOSIS — R4189 Other symptoms and signs involving cognitive functions and awareness: Secondary | ICD-10-CM

## 2021-07-14 MED ORDER — QUETIAPINE 25 MG PO TAB
ORAL_TABLET | Freq: Every evening | 3 refills | Status: AC
Start: 2021-07-14 — End: ?

## 2021-07-14 MED ORDER — IPRATROPIUM BROMIDE 21 MCG (0.03 %) NA SPRY
2 | Freq: Two times a day (BID) | NASAL | 0 refills | 30.00000 days | Status: AC
Start: 2021-07-14 — End: ?

## 2021-07-14 MED ORDER — CARBIDOPA-LEVODOPA 50-200 MG PO TBER
ORAL_TABLET | 3 refills | Status: AC
Start: 2021-07-14 — End: ?

## 2021-07-14 MED ORDER — RIVASTIGMINE TARTRATE 4.5 MG PO CAP
4.5 mg | ORAL_CAPSULE | Freq: Two times a day (BID) | ORAL | 3 refills | 90.00000 days | Status: AC
Start: 2021-07-14 — End: ?

## 2021-07-14 MED ORDER — CARBIDOPA-LEVODOPA 25-100 MG PO TAB
1 | ORAL_TABLET | Freq: Two times a day (BID) | ORAL | 3 refills | Status: AC
Start: 2021-07-14 — End: ?

## 2021-07-14 MED ORDER — LORAZEPAM 0.5 MG PO TAB
.5 mg | ORAL_TABLET | Freq: Two times a day (BID) | ORAL | 5 refills | 12.00000 days | Status: AC
Start: 2021-07-14 — End: ?

## 2021-07-14 NOTE — Assessment & Plan Note
Symptoms are worse since the last visit. I recommended increasing Exelon (rivastigmine) 4.5 mg twice a day.

## 2021-07-14 NOTE — Assessment & Plan Note
I recommended taking an extra Ativan (lorazepam) as needed.

## 2021-07-14 NOTE — Assessment & Plan Note
His symptoms are stable. On Sinemet (carbidopa/levodopa) 25/100 and Sinemet (carbidopa/levodopa) CR 50/200. No medication changes were recommended during the current  visit.

## 2021-07-14 NOTE — Progress Notes
Telehealth Visit Note    Date of Service: 07/14/2021    Subjective:      Obtained patient's verbal consent to treat them and their agreement to [facility] financial policy and NPP via this telehealth visit during the [facility] Emergency    Patient visit was conducted via [employer] using audio/video platform.       Damon Macdonald is a 71 y.o. male.    History of Present Illness    Overall he is getting worse mentally. He is having punding. He is using a cane and falls about 1-2 times/day. He is having light headedness. He is having drooling. He c/o of hallucination daily. He needs assistance with ADLs. He c/o in the morning and in the afternoon as far as  OFF times.        Review of Systems      Objective:         ? carbidopa-levodopa CR (SINEMET CR) 50/200 mg tablet TAKE ONE TABLET BY MOUTH AT 9AM, 11AM, 1PM, 3PM, 4PM, 6PM, and AT BEDTIME (7 tabs a day)   ? carbidopa/levodopa (SINEMET) 25/100 mg tablet Take one tablet by mouth twice daily. 8a-5p   ? cetirizine (ZYRTEC) 10 mg tablet Take 10 mg by mouth every morning.   ? escitalopram oxalate (LEXAPRO) 20 mg tablet TAKE 1 TABLET BY MOUTH EVERY DAY   ? ipratropium bromide (ATROVENT) 21 mcg (0.03 %) nasal spray Apply two sprays to each nostril as directed every 12 hours. Indications: runny nose   ? LORazepam (ATIVAN) 0.5 mg tablet Take one tablet by mouth twice daily. Plus 1 tab as needed   ? MELATONIN PO Take 5 mg by mouth.   ? meloxicam (MOBIC) 15 mg tablet Take 15 mg by mouth daily.   ? naproxen sodium (ALEVE PO) Take 1 tablet by mouth daily.   ? pimavanserin (NUPLAZID) 34 mg capsule Take one capsule by mouth daily.   ? QUEtiapine (SEROQUEL) 25 mg tablet 1 tab at 12n, 5p and 2 tab at bedtime   ? rivastigmine tartrate (EXELON) 4.5 mg capsule Take one capsule by mouth twice daily.     There were no vitals filed for this visit.  There is no height or weight on file to calculate BMI.      Telehealth Patient Reported Vitals     Row Name 07/14/21 1152 Weight: 57.6 kg (127 lb)        Height: 172.7 cm (5' 8)        Pain Location: GENERALIZED  fell at night                     Physical Exam    EXAMINATION:     ORIENTATION: Alert   Cranial Nerves: dysarthria    Right Left   Bradkinesia  Mild Mild   Tremor Hands Resting None None    Postural None None    Kinetic None None           Tremor Other: None  Dyskinesia: None  Muscle Strength: Unremarkable  Gait: Independent but with substantial impairment         Assessment and Plan:    Problem   Parkinson Disease (Hcc)    Symptoms began in 2010, initial symptoms was change in handwriting. Diagnosed with Parkinson's disease in 2013. Atypical PD Features: Rapid Progression, Tremor Absent and Severe Dysautonomia  10/26/2012 Mentation Score: 8 Activities of Daily Living Score: 28 Motor Exam: 25 Total UPDRS Score: 61   PDQ  39 IMPACT PDQ Total Percent: 58.33 %  01/22/2013 Symptoms got worse when Mirapex (pramipexole) was discontinued.   Got worse when Sinemet (carbidopa/levodopa) 25/100 was reduced  Patient was evaluated by Speech therapist, Mardella Layman Heidrick.    02/18/19  PDQ8 Total %: 62   08/20/2019 PDQ8 Total %: 53   09/02/2020 PDQ8 Total %: 84 Side effects to Inbrija (levodopa inhalation powder) coughing      Anxiety    On Ativan (lorazepam)      Hallucination    06/03/2015 Improved with Nuplazid (pimavanserin)   09/02/2020 On Seroquel (quetiapine)        Depression    10/26/2012 Geriatric Depression Scale: 15    01/22/2013 Geriatric Depression Scale: 16  On Lexapro (escitalopram)   06/24/2013 On Wellbutrin (bupropion)   01/08/2018 Wellbutrin (bupropion) was discontinued by Primary Care Physician   02/18/19: Geriatric Depression Scale: 18   09/02/2020 Geriatric Depression Scale: 21         Memory Change    10/26/2012 MOCA Score (out of 30): 20    08/13/12 MRI brain: Unremarkable  02/13/13 Neuropsychology testing: No evidence of dementia  01/08/2018 MOCA Score (out of 30): 25   09/02/2020 On Exelon (rivastigmine)      Orthostatic Hypotension 10/2014: florinef started. Patient reports significant improvement since starting.   06/18/2018 On Florinef (fludrocortisone)           Parkinson disease (HCC)  His symptoms are stable. On Sinemet (carbidopa/levodopa) 25/100 and Sinemet (carbidopa/levodopa) CR 50/200. No medication changes were recommended during the current  visit.      Anxiety  I recommended taking an extra Ativan (lorazepam) as needed.    Depression  His symptoms are stable. On Lexapro (escitalopram). No medication changes were recommended during the current  visit.      Hallucination  I recommended increasing Seroquel (quetiapine) 25 mg to 1-1-2.     Memory change  Symptoms are worse since the last visit. I recommended increasing Exelon (rivastigmine) 4.5 mg twice a day.    Orthostatic hypotension  His symptoms are stable. No medication changes were recommended during the current  visit.      Patient will follow up in approximately 3 months.           21 minutes spent on this patient's encounter with counseling and coordination of care taking >50% of the visit.

## 2021-07-14 NOTE — Assessment & Plan Note
I recommended increasing Seroquel (quetiapine) 25 mg to 1-1-2.

## 2021-07-14 NOTE — Assessment & Plan Note
His symptoms are stable. On Lexapro (escitalopram). No medication changes were recommended during the current  visit.

## 2021-07-14 NOTE — Assessment & Plan Note
His symptoms are stable. No medication changes were recommended during the current  visit.

## 2021-07-19 ENCOUNTER — Encounter: Admit: 2021-07-19 | Discharge: 2021-07-19 | Payer: MEDICARE

## 2021-07-19 NOTE — Telephone Encounter
We increased Seroquel (quetiapine) last week, did that not help?

## 2021-07-19 NOTE — Telephone Encounter
Pts wife reported that pt has been having visual hallucinations for the past 4 months. It takes tow on pts wife. Pt is currently on Nuplazid . Attempted to do PA for Atrovent as requested from [facility]. However when entering pts information ( key [id number]) it said pt has access to the med-n. Wife notified to let me know if not affordable via their current pharmacy to let me know which [facility] participating pharmacy to send prescription to. Wife verbalized understanding.

## 2021-07-22 ENCOUNTER — Encounter: Admit: 2021-07-22 | Discharge: 2021-07-22 | Payer: MEDICARE

## 2021-07-22 NOTE — Progress Notes
Contacted Damon Macdonald to discuss renewing assistance regarding their medication: Nuplazid.  Left voicemail asking patient to return call to the specialty pharmacy [phone]).  Will call patient again in 2 business days if no call back received..    The specialty pharmacy is currently waiting on the following information for Damon Macdonald's specialty medication, Nuplazid:    Patient's signature and Patient's financial information    The specialty pharmacy team has requested this information and until it is received the specialty pharmacy will not be able to continue the process for specialty medication access.    Shelva Majestic  Pharmacy Patient Advocate  [phone]

## 2021-07-22 NOTE — Progress Notes
Pharmacy Medication Re-assessment    The patient's caregiver Diamond Nickel, spouse) participated on the patient's behalf. All references to the patient herein were completed with the caregiver on the patient's behalf.    Indication/Regimen  The regimen of PIMAVANSERIN 34 MG PO CAP indefinitely is appropriate for [employer] who has Parkinson disease (HCC).    Renal dose adjustments are not required. Hepatic dose adjustments are not required. Dose titration is not required.    The patient has a caregiver who can administer the medication(s).    Baseline Characteristics  HPI: Patient with worsening hallucinations. Dose of quetiapine was increase about one week ago and has not helped reduce severity or frequency of hallucinations.  Current medications for this indication: quetiapine    Therapeutic Goals and Monitoring  The goal of therapy is reduction in severity and/or frequency of hallucinations and/or delusions.    Symptom Control  Symptom assessment: worsened    Hallucinations/PD Psychosis Reassessment (Nuplazid)  Over the past month have you seen, heard, smelled, or felt things that were not really there? Moderate (Formed hallucinations with loss of insight.)  How frequently do you see or hear things (visual and/or auditory hallucinations) that are not there? Almost always (every day)  Can you tell that these things (hallucinations, delusions) are not real? Patient report: After talking to other people who say they are not real, I am still not sure if they are real or not.  When you have the hallucinations do the hallucinations threaten you in any way? Sometimes    The patient is not making progress toward achieving their therapeutic goals, The plan is to continue current therapy. Continue Nuplazid and quetiapine. Notified provider of worsening hallucinations.    Past Medical History and Comorbidities  Patient Active Problem List   Diagnosis   ? Parkinson disease (HCC)   ? Depression   ? Memory change   ? Hypersomnia   ? Orthostatic hypotension   ? Dysarthria   ? Insomnia   ? REM sleep behavior disorder   ? Hallucination   ? 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 Dry eye syndrome of both eyes   ? Drooling   ? At risk for falls   ? Anxiety     Additional comorbidities: no    Labs and Diagnostic Tests  no recent labs    Allergies  Allergies   Allergen Reactions   ? Seasonal Allergies SEE COMMENTS     Runny nose, itchy eyes        Immunizations  Vaccine history was reviewed with the patient. Education was provided on the importance of completing vaccines.    Immunization History   Administered Date(s) Administered   ? COVID-19 (MODERNA), mRNA vacc, 100 mcg/0.5 mL (PF) 07/30/2019, 08/27/2019, 05/20/2020, 10/19/2020       Home Medications    Medication Sig   carbidopa-levodopa CR (SINEMET CR) 50/200 mg tablet TAKE ONE TABLET BY MOUTH AT 9AM, 11AM, 1PM, 3PM, 4PM, 6PM, and AT BEDTIME (7 tabs a day)   carbidopa/levodopa (SINEMET) 25/100 mg tablet Take one tablet by mouth twice daily. 8a-5p   cetirizine (ZYRTEC) 10 mg tablet Take 10 mg by mouth every morning.   escitalopram oxalate (LEXAPRO) 20 mg tablet TAKE 1 TABLET BY MOUTH EVERY DAY   ipratropium bromide (ATROVENT) 21 mcg (0.03 %) nasal spray Apply two sprays to each nostril as directed every 12 hours. Indications: runny nose   LORazepam (ATIVAN) 0.5 mg tablet Take one tablet by mouth twice daily.  Plus 1 tab as needed   MELATONIN PO Take 5 mg by mouth.   meloxicam (MOBIC) 15 mg tablet Take 15 mg by mouth daily.   naproxen sodium (ALEVE PO) Take 1 tablet by mouth daily.   pimavanserin (NUPLAZID) 34 mg capsule Take one capsule by mouth daily.   QUEtiapine (SEROQUEL) 25 mg tablet 1 tab at 12n, 5p and 2 tab at bedtime   rivastigmine tartrate (EXELON) 4.5 mg capsule Take one capsule by mouth twice daily.     Medication Reconciliation  Medication history and reconciliation were performed (including prescription medications, supplements, over the counter, and herbal products). The medication list was updated and the patient's current medication list is included above. The patient was instructed to speak with their health care provider before starting any new drug, including prescription or over the counter, natural / herbal products, or vitamins.    Drug Interactions    Drug-Drug Interactions  Drug-drug interactions were evaluated. There were clinically significant drug-drug interactions. The following drug-drug interactions were identified: pimavanserin and quetiapine and will be managed by recommend EKG .     Drug-Food Interactions  Drug-food interactions were evaluated. There are clinically significant drug-food interactions. with or without food. Grapefruit/grapefruit juice should be avoided.    Adverse Drug Reactions  Adverse drug reactions were reviewed with the patient.    Significant adverse drug reaction(s) were not identified.    Side effect(s) were not reported.    Adherence  Refill and adherence history were reviewed with the patient. The patient was educated on the importance of adherence.    Patient is adherent with refills: yes  Patient is meeting refill adherence goal: yes    Patient reported 0 missed doses over the past 7 days.  Significance of missed doses: NA - no missed doses   Patient is meeting reported adherence goal.    Safety Precautions    Risk Evaluation and Mitigation (REMS) Assessment: REMS is not required for this medication.    Safety precautions were addressed and discussed with the patient as applicable.    Contraindications: Virgel Gess Yokum does not have contraindications to this medication.      Pregnancy Status: Male, education not applicable.    Medication Education  Counseling was not completed because patient was previously educated and did not require additional counseling.    Virgel Gess Etheridge was given the opportunity to ask questions but did not have any questions at the time. Patient was reminded of the refill process and encouraged to call with questions. The monitoring and follow-up plan was discussed with the patient. The patient was instructed to contact their health care provider if their symptoms or health problems do not get better or if they become worse. The patient should contact the specialty pharmacy at [phone] if they have any questions or concerns regarding their medication therapy. The patient verbalized acceptance and understanding.    Follow-up Plan  The patient will be reassessed within 1 year.    The medication(s) will be shipped from [facility].    [name], PHARMD, BCPS

## 2021-07-26 ENCOUNTER — Encounter: Admit: 2021-07-26 | Discharge: 2021-07-26 | Payer: MEDICARE

## 2021-07-26 MED FILL — NUPLAZID 34 MG PO CAP: 34 mg | ORAL | 30 days supply | Qty: 30 | Fill #8 | Status: AC

## 2021-07-26 NOTE — Progress Notes
Copay assistance of $10000 was obtained for the specialty medication Nuplazid using grant from [facility] and now the copay is $0.  Damon Macdonald has stated this copay is affordable.  The specialty pharmacy will pursue additional copay assistance as necessary.  The specialty pharmacy will reach out to the ambulatory clinical pharmacist if the copay becomes unaffordable.    The medication will be delivered to patient's prescription address per the patient's request..    Shelva Majestic  Pharmacy Patient Advocate  [phone]

## 2021-08-03 ENCOUNTER — Encounter: Admit: 2021-08-03 | Discharge: 2021-08-03 | Payer: MEDICARE

## 2021-08-04 ENCOUNTER — Ambulatory Visit: Admit: 2021-08-04 | Discharge: 2021-08-05 | Payer: MEDICARE

## 2021-08-04 ENCOUNTER — Encounter: Admit: 2021-08-04 | Discharge: 2021-08-04 | Payer: MEDICARE

## 2021-08-04 DIAGNOSIS — F3289 Other specified depressive episodes: Secondary | ICD-10-CM

## 2021-08-04 DIAGNOSIS — R443 Hallucinations, unspecified: Secondary | ICD-10-CM

## 2021-08-04 DIAGNOSIS — G20 Parkinson's disease: Secondary | ICD-10-CM

## 2021-08-04 DIAGNOSIS — H547 Unspecified visual loss: Secondary | ICD-10-CM

## 2021-08-04 DIAGNOSIS — R4189 Other symptoms and signs involving cognitive functions and awareness: Secondary | ICD-10-CM

## 2021-08-04 NOTE — Assessment & Plan Note
His symptoms are stable. On Sinemet (carbidopa/levodopa) 25/100 and Sinemet (carbidopa/levodopa) CR 50/200. No medication changes were recommended during the current  visit.

## 2021-08-04 NOTE — Assessment & Plan Note
Symptoms are worse since the last visit. He will try 3 tab of Seroquel (quetiapine) at bedtime until we get Clozaril (clozapine) started at 12.5 mg at bedtime. At that time we will change Seroquel (quetiapine) 25 mg twice a day as needed.

## 2021-08-04 NOTE — Assessment & Plan Note
His symptoms are stable. On Lexapro (escitalopram). No medication changes were recommended during the current  visit.

## 2021-08-04 NOTE — Progress Notes
Telehealth Visit Note    Date of Service: 08/04/2021    Subjective:      Obtained patient's verbal consent to treat them and their agreement to [facility] financial policy and NPP via this telehealth visit during the [facility] Emergency    Patient visit was conducted via [employer] using audio/video platform.       Damon Macdonald is a 71 y.o. male.    History of Present Illness    Overall he is getting worse mentally. He is having punding. He is using a cane and walker and falls about 1-2 times/day. He is having light headedness. He is having drooling. He c/o of hallucination daily. He needs assistance with ADLs. He c/o in the morning and in the afternoon as far as  OFF times. He c/o of difficulty sleeping.        Review of Systems      Objective:         ? carbidopa-levodopa CR (SINEMET CR) 50/200 mg tablet TAKE ONE TABLET BY MOUTH AT 9AM, 11AM, 1PM, 3PM, 4PM, 6PM, and AT BEDTIME (7 tabs a day) (Patient taking differently: Take one tablet by mouth six times daily.)   ? carbidopa/levodopa (SINEMET) 25/100 mg tablet Take one tablet by mouth twice daily. 8a-5p   ? cetirizine (ZYRTEC) 10 mg tablet Take one tablet by mouth every morning.   ? docusate (COLACE) 100 mg capsule Take one capsule by mouth daily.   ? escitalopram oxalate (LEXAPRO) 20 mg tablet TAKE 1 TABLET BY MOUTH EVERY DAY   ? ipratropium bromide (ATROVENT) 21 mcg (0.03 %) nasal spray Apply two sprays to each nostril as directed every 12 hours. Indications: runny nose   ? LORazepam (ATIVAN) 0.5 mg tablet Take one tablet by mouth twice daily. Plus 1 tab as needed   ? MELATONIN PO Take 10 mg by mouth at bedtime daily.   ? meloxicam (MOBIC) 15 mg tablet Take one tablet by mouth daily.   ? naproxen sodium (ALEVE PO) Take 1 tablet by mouth daily.   ? pimavanserin (NUPLAZID) 34 mg capsule Take one capsule by mouth daily.   ? QUEtiapine (SEROQUEL) 25 mg tablet 1 tab at 12n, 5p and 3 tab at bedtime (Patient taking differently: 1 tab at noon - 2 tab at 5pm)   ? rivastigmine tartrate (EXELON) 4.5 mg capsule Take one capsule by mouth twice daily.     There were no vitals filed for this visit.  There is no height or weight on file to calculate BMI.      Telehealth Patient Reported Vitals     Row Name 08/04/21 0931                Weight: 50.8 kg (112 lb)        Height: 172.7 cm (5' 8)        Pain Score: --  unable to assess                    Physical Exam    EXAMINATION:     ORIENTATION: Alert   Cranial Nerves: dysarthria    Right Left   Bradkinesia  Mild Mild   Tremor Hands Resting None None    Postural None None    Kinetic None None           Tremor Other: None  Dyskinesia: None  Muscle Strength: Unremarkable  Gait: Independent but with substantial impairment         Assessment  and Plan:    Problem   Parkinson Disease (Hcc)    Symptoms began in 2010, initial symptoms was change in handwriting. Diagnosed with Parkinson's disease in 2013. Atypical PD Features: Rapid Progression, Tremor Absent and Severe Dysautonomia  10/26/2012 Mentation Score: 8 Activities of Daily Living Score: 28 Motor Exam: 25 Total UPDRS Score: 61   PDQ 39 IMPACT PDQ Total Percent: 58.33 %  01/22/2013 Symptoms got worse when Mirapex (pramipexole) was discontinued.   Got worse when Sinemet (carbidopa/levodopa) 25/100 was reduced  Patient was evaluated by Speech therapist, Mardella Layman Heidrick.    02/18/19  PDQ8 Total %: 62   08/20/2019 PDQ8 Total %: 53   09/02/2020 PDQ8 Total %: 84 Side effects to Inbrija (levodopa inhalation powder) coughing      Hallucination    06/03/2015 Improved with Nuplazid (pimavanserin)   09/02/2020 On Seroquel (quetiapine)        Depression    10/26/2012 Geriatric Depression Scale: 15    01/22/2013 Geriatric Depression Scale: 16  On Lexapro (escitalopram)   06/24/2013 On Wellbutrin (bupropion)   01/08/2018 Wellbutrin (bupropion) was discontinued by Primary Care Physician   02/18/19: Geriatric Depression Scale: 18   09/02/2020 Geriatric Depression Scale: 21             Parkinson disease (HCC)  His symptoms are stable. On Sinemet (carbidopa/levodopa) 25/100 and Sinemet (carbidopa/levodopa) CR 50/200. No medication changes were recommended during the current  visit.      Depression  His symptoms are stable. On Lexapro (escitalopram). No medication changes were recommended during the current  visit.      Hallucination  Symptoms are worse since the last visit. He will try 3 tab of Seroquel (quetiapine) at bedtime until we get Clozaril (clozapine) started at 12.5 mg at bedtime. At that time we will change Seroquel (quetiapine) 25 mg twice a day as needed.    Patient will follow up in approximately 1 months.           21 minutes spent on this patient's encounter with counseling and coordination of care taking >50% of the visit.

## 2021-08-09 ENCOUNTER — Encounter: Admit: 2021-08-09 | Discharge: 2021-08-09 | Payer: MEDICARE

## 2021-08-16 ENCOUNTER — Encounter: Admit: 2021-08-16 | Discharge: 2021-08-16 | Payer: MEDICARE

## 2021-08-24 ENCOUNTER — Encounter: Admit: 2021-08-24 | Discharge: 2021-08-24 | Payer: MEDICARE

## 2021-08-25 ENCOUNTER — Encounter: Admit: 2021-08-25 | Discharge: 2021-08-25 | Payer: MEDICARE

## 2021-08-25 NOTE — Telephone Encounter
Refaxed CBC with diff order to [facility] at [phone]. Confirmation page received at 1225 pm. Per nurse mary, pts spouse will possibly switch care for pt from current pcp to this clinic or possibly partially. Pt would need to be seen there initially. Per Corrie Dandy, they did receive medical records from [place].

## 2021-08-27 ENCOUNTER — Encounter: Admit: 2021-08-27 | Discharge: 2021-08-27 | Payer: MEDICARE

## 2021-08-27 NOTE — Telephone Encounter
Called to [facility] to f/u on pts cbc with diff for Clozaril start. Nurse there said that pt couldn't make it there d/t weakness. Spouse could get him out of the bed. Spoke to pts spouse and she said he is sleeping now but feeling a bit better. Advised to spouse to check pt for UTI. And update [place] of results and if she was able to take pt in for the cbc. She verbalized understanding.   Dr.Pahwa do you think we should refer pt to our palliative care ?

## 2021-08-30 ENCOUNTER — Encounter: Admit: 2021-08-30 | Discharge: 2021-08-30 | Payer: MEDICARE

## 2021-09-02 ENCOUNTER — Encounter: Admit: 2021-09-02 | Discharge: 2021-09-02 | Payer: MEDICARE

## 2021-09-02 NOTE — Telephone Encounter
LV to pts spouse o f/u if blood work is done or in the plan for proceeding for Clozaril. Call back provided

## 2021-09-15 ENCOUNTER — Encounter: Admit: 2021-09-15 | Discharge: 2021-09-15 | Payer: MEDICARE

## 2021-09-15 MED FILL — NUPLAZID 34 MG PO CAP: 34 mg | ORAL | 30 days supply | Qty: 30 | Fill #9 | Status: AC

## 2021-10-05 ENCOUNTER — Encounter: Admit: 2021-10-05 | Discharge: 2021-10-05 | Payer: MEDICARE

## 2021-10-11 ENCOUNTER — Encounter: Admit: 2021-10-11 | Discharge: 2021-10-11 | Payer: MEDICARE

## 2021-10-11 NOTE — Progress Notes
POST-MORTEM DOCUMENTATION    Name: Damon Macdonald   MRN: [id number]     DOB: Aug 13, 1950      Age: 71 y.o.  Admission Date: (Not on file)     LOS: 0 days     Date of Service: 10/11/2021        How were you notified?  Staff message in O2  Date: not sure possibly 10-20-2021  Who notified us of death: Payton Doughty, [place] pharmacy pts rep

## 2021-10-18 DEATH — deceased

## 2021-10-25 ENCOUNTER — Encounter: Admit: 2021-10-25 | Discharge: 2021-10-25 | Payer: MEDICARE

## 2023-04-21 IMAGING — CR [ID]
3 series · 3 of 3 positions shown · non-contrast
Comparison: none

[hand pa]
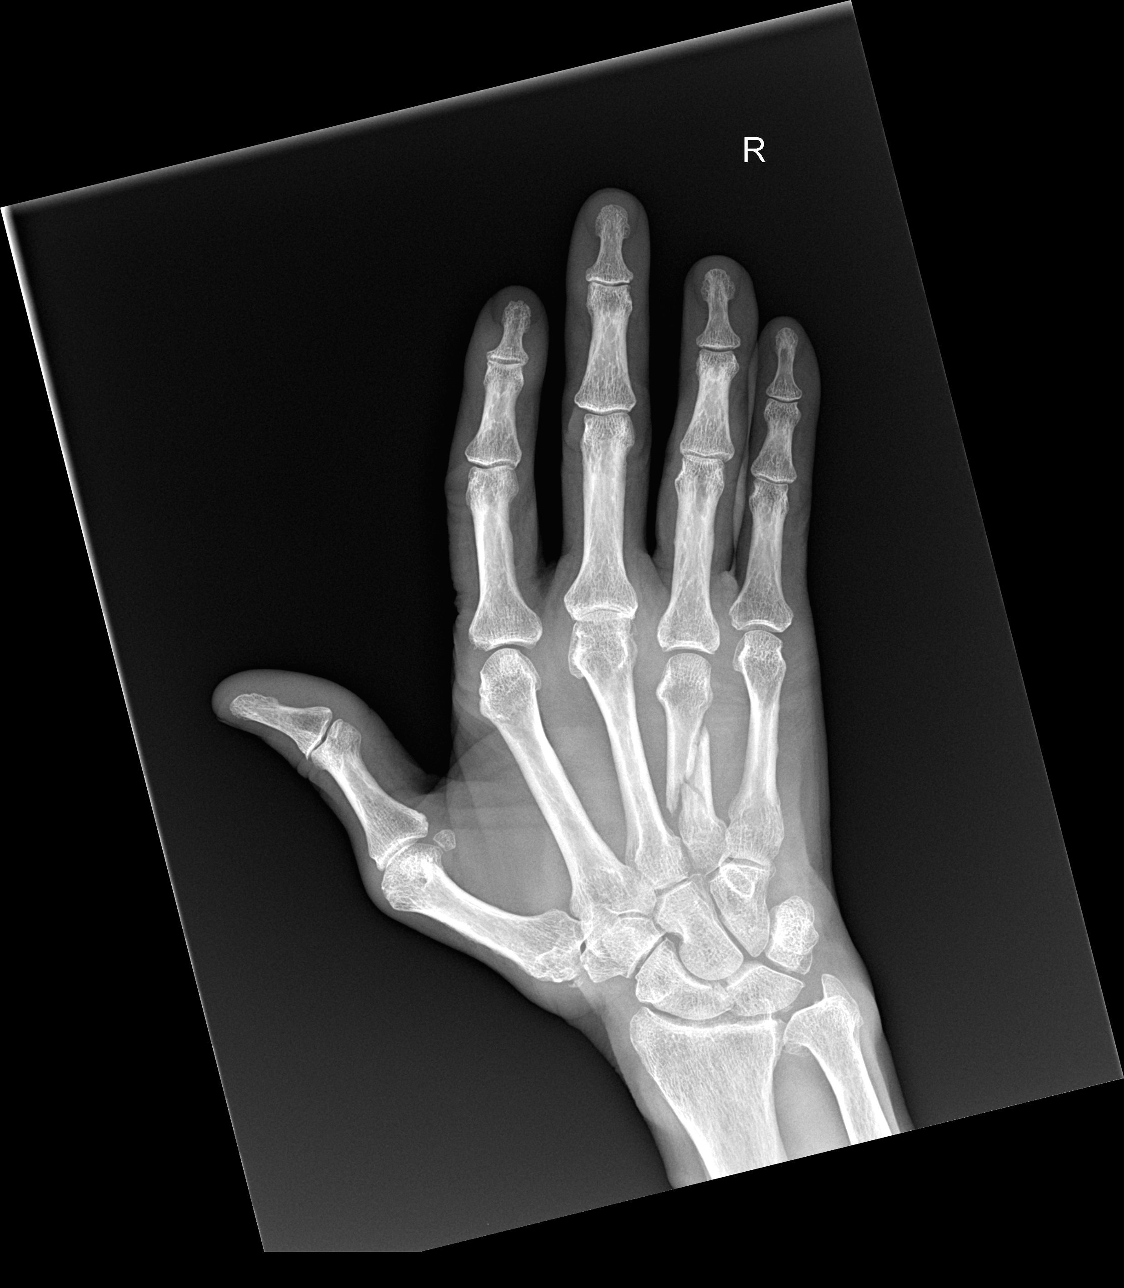

[hand obl]
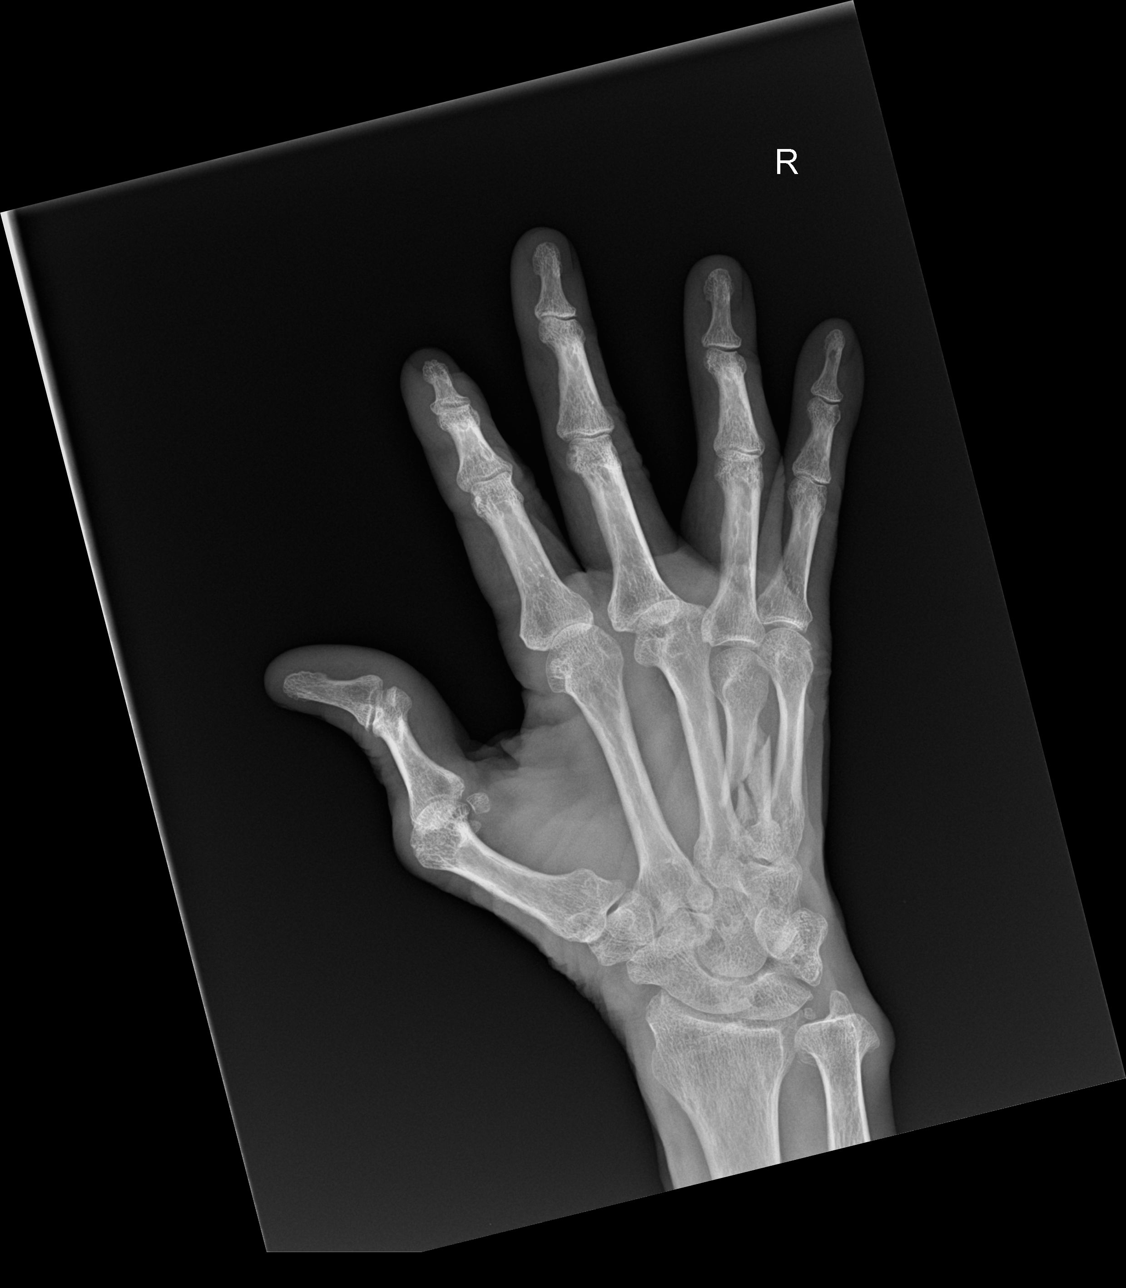

[hand lat]
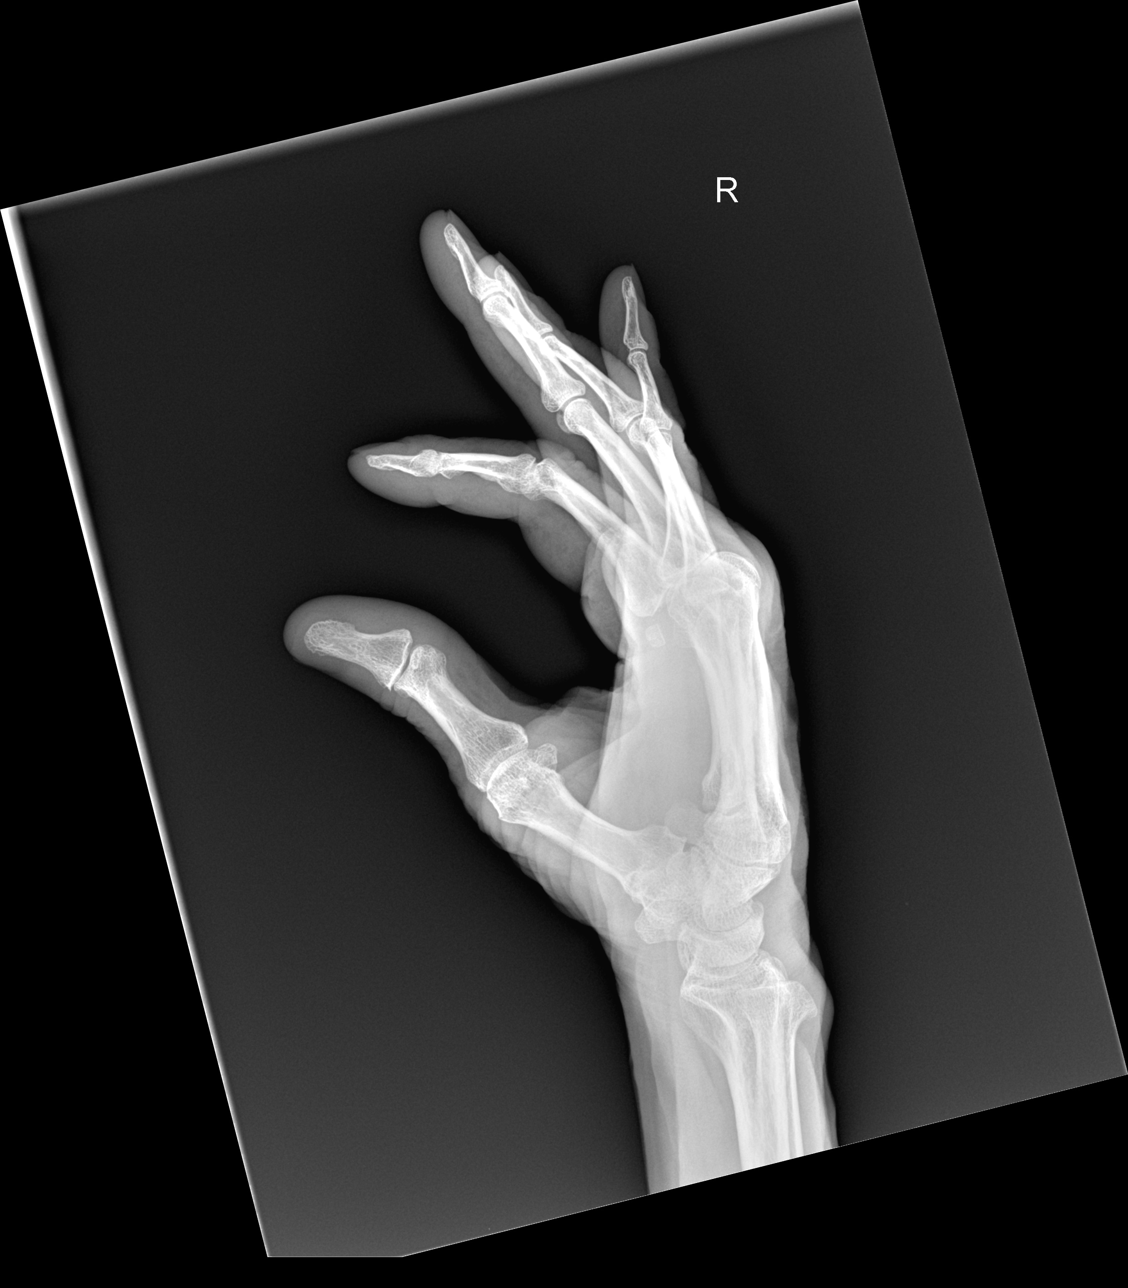

[3 of 3 positions shown; findings below may reference images not displayed]

DIAGNOSTIC STUDIES

EXAM

XR hand RT min 3V

INDICATION

fx f/u
rt hand f/u fx from 10/21/20, pain, no new trauma. KF

TECHNIQUE

AP lateral and oblique views right hand

COMPARISONS

October 21, 2020

FINDINGS

Re-demonstrated is 4th metacarpal fracture. There is mild shortening and ulnar variation similar to
prior exam. No new fractures are seen. No significant callus formation is evident.

IMPRESSION

Fourth metacarpal fracture as described.

Tech Notes:

rt hand f/u fx from 10/21/20, pain, no new trauma. KF
